# Patient Record
Sex: Male | Born: 1960 | Race: White | Hispanic: No | Marital: Married | Smoking: Current every day smoker
Health system: Southern US, Community
[De-identification: ages and names within clinical notes are randomized; demographics above are authoritative.]

## PROBLEM LIST (undated history)

## (undated) DIAGNOSIS — I639 Cerebral infarction, unspecified: Secondary | ICD-10-CM

## (undated) DIAGNOSIS — E119 Type 2 diabetes mellitus without complications: Secondary | ICD-10-CM

## (undated) DIAGNOSIS — E785 Hyperlipidemia, unspecified: Secondary | ICD-10-CM

## (undated) DIAGNOSIS — I10 Essential (primary) hypertension: Secondary | ICD-10-CM

## (undated) DIAGNOSIS — E7800 Pure hypercholesterolemia, unspecified: Secondary | ICD-10-CM

## (undated) HISTORY — DX: Pure hypercholesterolemia, unspecified: E78.00

## (undated) HISTORY — PX: OTHER SURGICAL HISTORY: SHX169

---

## 2002-08-27 ENCOUNTER — Emergency Department (HOSPITAL_COMMUNITY): Admission: EM | Admit: 2002-08-27 | Discharge: 2002-08-27 | Payer: Self-pay | Admitting: *Deleted

## 2005-08-31 ENCOUNTER — Emergency Department (HOSPITAL_COMMUNITY): Admission: EM | Admit: 2005-08-31 | Discharge: 2005-08-31 | Payer: Self-pay | Admitting: Emergency Medicine

## 2012-12-08 ENCOUNTER — Inpatient Hospital Stay (HOSPITAL_COMMUNITY)
Admission: EM | Admit: 2012-12-08 | Discharge: 2012-12-11 | DRG: 065 | Disposition: A | Discharge status: Home / Self Care | Payer: MEDICAID | Attending: Internal Medicine | Admitting: Internal Medicine

## 2012-12-08 ENCOUNTER — Emergency Department (HOSPITAL_COMMUNITY): Payer: Self-pay

## 2012-12-08 ENCOUNTER — Encounter (HOSPITAL_COMMUNITY): Payer: Self-pay | Admitting: Cardiology

## 2012-12-08 DIAGNOSIS — I639 Cerebral infarction, unspecified: Secondary | ICD-10-CM

## 2012-12-08 DIAGNOSIS — I10 Essential (primary) hypertension: Secondary | ICD-10-CM | POA: Diagnosis present

## 2012-12-08 DIAGNOSIS — I6329 Cerebral infarction due to unspecified occlusion or stenosis of other precerebral arteries: Secondary | ICD-10-CM | POA: Diagnosis present

## 2012-12-08 DIAGNOSIS — E119 Type 2 diabetes mellitus without complications: Secondary | ICD-10-CM | POA: Diagnosis present

## 2012-12-08 DIAGNOSIS — Z8249 Family history of ischemic heart disease and other diseases of the circulatory system: Secondary | ICD-10-CM

## 2012-12-08 DIAGNOSIS — I6340 Cerebral infarction due to embolism of unspecified cerebral artery: Principal | ICD-10-CM | POA: Diagnosis present

## 2012-12-08 DIAGNOSIS — Q2111 Secundum atrial septal defect: Secondary | ICD-10-CM

## 2012-12-08 DIAGNOSIS — F17200 Nicotine dependence, unspecified, uncomplicated: Secondary | ICD-10-CM | POA: Diagnosis present

## 2012-12-08 DIAGNOSIS — I252 Old myocardial infarction: Secondary | ICD-10-CM

## 2012-12-08 DIAGNOSIS — Z8673 Personal history of transient ischemic attack (TIA), and cerebral infarction without residual deficits: Secondary | ICD-10-CM

## 2012-12-08 DIAGNOSIS — E785 Hyperlipidemia, unspecified: Secondary | ICD-10-CM | POA: Diagnosis present

## 2012-12-08 DIAGNOSIS — R9431 Abnormal electrocardiogram [ECG] [EKG]: Secondary | ICD-10-CM | POA: Diagnosis present

## 2012-12-08 DIAGNOSIS — Z79899 Other long term (current) drug therapy: Secondary | ICD-10-CM

## 2012-12-08 DIAGNOSIS — Q211 Atrial septal defect: Secondary | ICD-10-CM

## 2012-12-08 HISTORY — DX: Hyperlipidemia, unspecified: E78.5

## 2012-12-08 HISTORY — DX: Essential (primary) hypertension: I10

## 2012-12-08 LAB — BASIC METABOLIC PANEL
BUN: 26 mg/dL — ABNORMAL HIGH (ref 6–23)
CO2: 29 mEq/L (ref 19–32)
Calcium: 10.4 mg/dL (ref 8.4–10.5)
Chloride: 100 mEq/L (ref 96–112)
Creatinine, Ser: 0.99 mg/dL (ref 0.50–1.35)
GFR calc non Af Amer: >90 mL/min (ref >90)
Potassium: 4.1 mEq/L (ref 3.5–5.1)
Sodium: 142 mEq/L (ref 135–145)

## 2012-12-08 LAB — CBC
HCT: 45.6 % (ref 39.0–52.0)
Hemoglobin: 16.4 g/dL (ref 13.0–17.0)
MCH: 29.9 pg (ref 26.0–34.0)
MCHC: 36 g/dL (ref 30.0–36.0)
MCV: 83.2 fL (ref 78.0–100.0)
Platelets: 257 10*3/uL (ref 150–400)
RBC: 5.48 MIL/uL (ref 4.22–5.81)
WBC: 11 10*3/uL — ABNORMAL HIGH (ref 4.0–10.5)

## 2012-12-08 LAB — POCT I-STAT TROPONIN I: Troponin i, poc: 0 ng/mL (ref 0.00–0.08)

## 2012-12-08 MED ORDER — ASPIRIN EC 81 MG PO TBEC
81.0000 mg | DELAYED_RELEASE_TABLET | Freq: Every day | ORAL | Status: DC
  Administered 2012-12-09: 81 mg via ORAL
  Filled 2012-12-08: qty 1

## 2012-12-08 MED ORDER — HEPARIN SODIUM (PORCINE) 5000 UNIT/ML IJ SOLN
5000.0000 [IU] | Freq: Three times a day (TID) | INTRAMUSCULAR | Status: DC
  Administered 2012-12-09 – 2012-12-11 (×6): 5000 [IU] via SUBCUTANEOUS
  Filled 2012-12-08 (×10): qty 1

## 2012-12-08 MED ORDER — LOSARTAN POTASSIUM-HCTZ 50-12.5 MG PO TABS: 1.0000 | ORAL_TABLET | Freq: Every evening | ORAL | Status: DC

## 2012-12-08 MED ORDER — SIMVASTATIN 10 MG PO TABS
10.0000 mg | ORAL_TABLET | Freq: Every day | ORAL | Status: DC
  Filled 2012-12-08: qty 1

## 2012-12-08 MED ORDER — INSULIN ASPART 100 UNIT/ML ~~LOC~~ SOLN
0.0000 [IU] | Freq: Three times a day (TID) | SUBCUTANEOUS | Status: DC
  Administered 2012-12-09: 5 [IU] via SUBCUTANEOUS
  Administered 2012-12-09 (×2): 3 [IU] via SUBCUTANEOUS
  Administered 2012-12-10: 2 [IU] via SUBCUTANEOUS
  Administered 2012-12-10 – 2012-12-11 (×2): 3 [IU] via SUBCUTANEOUS
  Administered 2012-12-11: 8 [IU] via SUBCUTANEOUS

## 2012-12-08 NOTE — H&P (Addendum)
[facility] History and Physical  Gregory Gordon ZOX:[id number] DOB: [birth year] DOA: 12/08/2012  Referring physician: ED PCP: Default, Provider, MD  Specialists: None  Chief Complaint: Dizziness  HPI: Gregory Gordon is a 52 y.o. male who presents to the ED with dizziness.  Symptoms onset 4 days ago after mowing the grass outside.  Quality is a "room spinning sensation" relieved by lying flat and motionless.  Was associated nausea and vomiting which has since resolved.  Seen by PCP yesterday, given antivert but this provided no relief.  In ED work up demonstrated acute on chronic B cerebellar ischemic strokes, BGL was elevated at 256 (patient has no formal diagnosis of DM though it sounds like such may have been suspected previously), neurology has been consulted and hospitalist has been asked to admit.  Review of Systems: 12 systems reviewed and otherwise negative.  Past Medical History  Diagnosis Date  . Hypertension   . Hyperlipemia    Past Surgical History  Procedure Laterality Date  . Neg hx     Social History:  reports that he has been smoking Cigarettes.  He has been smoking about 1.00 pack per day. He does not have any smokeless tobacco history on file. He reports that he does not drink alcohol or use illicit drugs.   No Known Allergies  Family History  Problem Relation Age of Onset  . Heart attack Brother 16    Younger brother just had 2nd MI     Prior to Admission medications   Medication Sig Start Date End Date Taking? Authorizing Provider  acetaminophen (TYLENOL) 650 MG CR tablet Take 1,950 mg by mouth daily as needed for pain.   Yes Historical Provider, MD  losartan-hydrochlorothiazide (HYZAAR) 50-12.5 MG per tablet Take 1 tablet by mouth every evening.   Yes Historical Provider, MD  meclizine (ANTIVERT) 25 MG tablet Take 25 mg by mouth 3 (three) times daily.   Yes Historical Provider, MD  pravastatin (PRAVACHOL) 20 MG tablet Take 20 mg by mouth every other  day.   Yes Historical Provider, MD   Physical Exam: Filed Vitals:   12/08/12 2145 12/08/12 2200 12/08/12 2222 12/08/12 2300  BP:  129/82 129/82 153/93  Pulse: 62 62 59 67  Temp:      TempSrc:      Resp: 17 20 18 22   SpO2: 90% 93% 91% 94%    General:  NAD, resting comfortably in bed Eyes: PEERLA EOMI ENT: mucous membranes moist Neck: supple w/o JVD Cardiovascular: RRR w/o MRG Respiratory: CTA B Abdomen: soft, nt, nd, bs+ Skin: no rash nor lesion Musculoskeletal: MAE, full ROM all 4 extremities Psychiatric: normal tone and affect Neurologic: AAOx3, questionable abnormal finger to nose testing on the L side, otherwise no obvious focal deficits to my exam  Labs on Admission:  Basic Metabolic Panel:  Recent Labs Lab 12/08/12 1802  NA 142  K 4.1  CL 100  CO2 29  GLUCOSE 256*  BUN 26*  CREATININE 0.99  CALCIUM 10.4   Liver Function Tests: No results found for this basename: AST, ALT, ALKPHOS, BILITOT, PROT, ALBUMIN,  in the last 168 hours No results found for this basename: LIPASE, AMYLASE,  in the last 168 hours No results found for this basename: AMMONIA,  in the last 168 hours CBC:  Recent Labs Lab 12/08/12 1802  WBC 11.0*  HGB 16.4  HCT 45.6  MCV 83.2  PLT 257   Cardiac Enzymes: No results found for this basename: CKTOTAL, CKMB, CKMBINDEX,  TROPONINI,  in the last 168 hours  BNP (last 3 results) No results found for this basename: PROBNP,  in the last 8760 hours CBG: No results found for this basename: GLUCAP,  in the last 168 hours  Radiological Exams on Admission: Dg Chest 2 View  12/08/2012  *RADIOLOGY REPORT*  Clinical Data: 4-day history of nausea and vomiting and dizziness. Smoker with current history of hypertension.  CHEST - 2 VIEW  Comparison: None.  Findings: Suboptimal inspiration accounts for crowded bronchovascular markings, especially in the lung bases, and accentuates the cardiac silhouette.  Taking this into account, cardiac silhouette  upper normal in size.  Hilar and mediastinal contours otherwise unremarkable.  Lungs clear.  No pleural effusions.  Visualized bony thorax intact.  IMPRESSION: Suboptimal inspiration.  No acute cardiopulmonary disease.   Original Report Authenticated By: Hulan Saas, M.D.    Mr [facility] Wo Contrast  12/08/2012  *RADIOLOGY REPORT*  Clinical Data:  52 year old male with dizziness since Saturday. Vertigo.  Chest pain, neck pain.  Comparison: None.  MRI HEAD WITHOUT CONTRAST  Technique: Multiplanar, multiecho pulse sequences of the brain and surrounding structures were obtained according to standard protocol without intravenous contrast.  Findings: There is a 3 cm area of densely restricted diffusion in the inferior left cerebellar hemisphere. The left cerebellar vermis is affected. There is a smaller 15 mm area of densely restricted diffusion in the inferior right cerebellar hemisphere.  There are scattered additional foci of restricted diffusion in the cerebellar hemispheres.  There are underlying chronic infarcts in both posterior cerebellar hemispheres (series 3 image 7  and series 3 100 image 7).  No definite acute or chronic infarcts in the brain stem. Major intracranial vascular flow voids are preserved with intracranial artery dolichoectasia noted. However, there is abnormal increased FLAIR signal and decreased T2 signal in the the left PICA or a branch of the left PICA (series 6 image 5 and series 8 image 5) compatible with a slow flow or thrombus.  There is adjacent either punctate petechial hemorrhage or additional intravascular thrombus near the left cerebellar vermis (series 8 image 5).  MRA findings are below.  Associated T2 and FLAIR hyperintensity plus T1 hypointensity in the acutely affected areas.  No significant mass effect. No other acute intracranial hemorrhage.  No supratentorial diffusion abnormality.  No ventriculomegaly. Negative pituitary.  Negative cervicomedullary junction and  visualized cervical spine.  Scattered patchy cerebral white matter T2 and FLAIR hyperintensity, mild to moderate for age.  No supratentorial encephalomalacia.  Visualized bone marrow signal is within normal limits.  Visualized orbit soft tissues are within normal limits.  Minor paranasal sinus mucosal thickening.  Mastoids are clear.  Negative visualized internal auditory structures.  Negative scalp soft tissues.  IMPRESSION: 1.  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 No significant mass effect.  Possible petechial hemorrhage in the left cerebellar vermis.  Evidence of left PICA or a PICA branch slow flow or occlusion. 2.  No other acute intracranial abnormality.  Intracranial artery dolichoectasia.  See MRA findings below.  3.  Mild to moderate for age nonspecific cerebral white matter signal changes, favor chronic small vessel disease.  MRA HEAD WITHOUT CONTRAST  Technique: Angiographic images of the Circle of Willis were obtained using MRA technique without  intravenous contrast.  Findings: Antegrade flow in codominant distal vertebral arteries. Moderate to severe tortuosity of the V4 segments and vertebrobasilar junction.  No definite stenosis; mild asymmetric signal intensity is felt related to the  vessel tortuosity.  No PICA flow signal identified on either side.  No definite AICA flow signal.  No basilar artery stenosis.  SCA and PCA origins are within normal limits.  Posterior communicating arteries are diminutive or absent.  Bilateral PCA branches are within normal limits.  Antegrade flow in both ICA siphons.  Mildly tortuous distal cervical right ICA.  Mild to moderate cavernous ICA tortuosity. Both ophthalmic artery origins are within normal limits.  Normal carotid termini, MCA and ACA origins.  Anterior communicating artery is diminutive or absent.  Tortuous proximal ACA branches. Bilateral MCA M1 segments are within normal limits.  Mild irregularity of the bilateral M2  segments.  Otherwise negative visualized bilateral MCA branches.  IMPRESSION: 1.  No bilateral PICA or AICA flow signal detected.  Tortuous distal vertebral arteries appear to be patent without definite lesion. 2.  Otherwise negative posterior circulation. 3.  Mild to moderate ICA siphon tortuosity.  Mild bilateral MCA M2 branch irregularity. 4.  Otherwise negative anterior circulation.  Salient findings on this study discussed on 12/08/2012 at 2215 hours with PA Sharilyn Sites in the ED.   Original Report Authenticated By: Erskine Speed, M.D.    Mr Brain Wo Contrast  12/08/2012  *RADIOLOGY REPORT*  Clinical Data:  52 year old male with dizziness since Saturday. Vertigo.  Chest pain, neck pain.  Comparison: None.  MRI HEAD WITHOUT CONTRAST  Technique: Multiplanar, multiecho pulse sequences of the brain and surrounding structures were obtained according to standard protocol without intravenous contrast.  Findings: There is a 3 cm area of densely restricted diffusion in the inferior left cerebellar hemisphere. The left cerebellar vermis is affected. There is a smaller 15 mm area of densely restricted diffusion in the inferior right cerebellar hemisphere.  There are scattered additional foci of restricted diffusion in the cerebellar hemispheres.  There are underlying chronic infarcts in both posterior cerebellar hemispheres (series 3 image 7  and series 3 100 image 7).  No definite acute or chronic infarcts in the brain stem. Major intracranial vascular flow voids are preserved with intracranial artery dolichoectasia noted. However, there is abnormal increased FLAIR signal and decreased T2 signal in the the left PICA or a branch of the left PICA (series 6 image 5 and series 8 image 5) compatible with a slow flow or thrombus.  There is adjacent either punctate petechial hemorrhage or additional intravascular thrombus near the left cerebellar vermis (series 8 image 5).  MRA findings are below.  Associated T2 and FLAIR  hyperintensity plus T1 hypointensity in the acutely affected areas.  No significant mass effect. No other acute intracranial hemorrhage.  No supratentorial diffusion abnormality.  No ventriculomegaly. Negative pituitary.  Negative cervicomedullary junction and visualized cervical spine.  Scattered patchy cerebral white matter T2 and FLAIR hyperintensity, mild to moderate for age.  No supratentorial encephalomalacia.  Visualized bone marrow signal is within normal limits.  Visualized orbit soft tissues are within normal limits.  Minor paranasal sinus mucosal thickening.  Mastoids are clear.  Negative visualized internal auditory structures.  Negative scalp soft tissues.  IMPRESSION: 1.  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 No significant mass effect.  Possible petechial hemorrhage in the left cerebellar vermis.  Evidence of left PICA or a PICA branch slow flow or occlusion. 2.  No other acute intracranial abnormality.  Intracranial artery dolichoectasia.  See MRA findings below.  3.  Mild to moderate for age nonspecific cerebral white matter signal changes, favor chronic small vessel disease.  MRA  HEAD WITHOUT CONTRAST  Technique: Angiographic images of the Circle of Willis were obtained using MRA technique without  intravenous contrast.  Findings: Antegrade flow in codominant distal vertebral arteries. Moderate to severe tortuosity of the V4 segments and vertebrobasilar junction.  No definite stenosis; mild asymmetric signal intensity is felt related to the vessel tortuosity.  No PICA flow signal identified on either side.  No definite AICA flow signal.  No basilar artery stenosis.  SCA and PCA origins are within normal limits.  Posterior communicating arteries are diminutive or absent.  Bilateral PCA branches are within normal limits.  Antegrade flow in both ICA siphons.  Mildly tortuous distal cervical right ICA.  Mild to moderate cavernous ICA tortuosity. Both ophthalmic artery  origins are within normal limits.  Normal carotid termini, MCA and ACA origins.  Anterior communicating artery is diminutive or absent.  Tortuous proximal ACA branches. Bilateral MCA M1 segments are within normal limits.  Mild irregularity of the bilateral M2 segments.  Otherwise negative visualized bilateral MCA branches.  IMPRESSION: 1.  No bilateral PICA or AICA flow signal detected.  Tortuous distal vertebral arteries appear to be patent without definite lesion. 2.  Otherwise negative posterior circulation. 3.  Mild to moderate ICA siphon tortuosity.  Mild bilateral MCA M2 branch irregularity. 4.  Otherwise negative anterior circulation.  Salient findings on this study discussed on 12/08/2012 at 2215 hours with PA Sharilyn Sites in the ED.   Original Report Authenticated By: Erskine Speed, M.D.     EKG: Independently reviewed.  No prior available for comparison.  Q waves noted in inferior leads, see discussion below.  Assessment/Plan Principal Problem:   Arterial ischemic stroke, vertebrobasilar, cerebellar, acute Active Problems:   DM2 (diabetes mellitus, type 2)   Dyslipidemia   HTN (hypertension)   Family history of early CAD   Q waves suggestive of previous myocardial infarction   1. Acute on chronic ischemic stroke - neurology to see patient suspect he will be put on ASA, patient with numerous risk factors for atherosclerotic disease, specifically: 1. family history of early onset CAD (51 yo younger brother just had 2nd MI) 2. high cholesterol - continue home statin, checking cholesterol with AM labs 3. HTN - continue HTN meds, given that he is 4 days into this stroke dont think that permissive HTN has any role at this point, will of course defer to neurology on this if they recommend otherwise. 4. Smoking - addressed need to quit with patient, patient agrees to do so 5. Strongly suspect DM2 - A1C pending, BGL 250s in ED, has had multiple very high readings in past per patient but not on  any formal treatment.  Putting patient on carb modified diet and starting SSI while inpatient, likely will need long term treatment for this. 2. Plan - as part of the work up have ordered the B carotid US, although this would NOT be the cause of the posterior strokes, I feel it to be reasonable given my concern of underlying atherosclerotic disease, would cancel this if neurology has a different vascular imaging modality planned for the patients neck (re: vertebral arteries). 3. Q waves concerning for possible previous MI - while the Q wave in lead 3 could be a normal variant, his history of confirmed atherosclerotic disease (with multiple strokes), strong family history including early onset MIs, and every other risk factor, are concerning for CAD.  Probably should see a cardiologist as well after this visit though this is not emergent as he  is having no symptoms at present time.  Neurology consulted and is seeing the patient, expect they will likely start ASA on patient.  Code Status: Full Code (must indicate code status--if unknown or must be presumed, indicate so) Family Communication: Spoke with family at bedside (indicate person spoken with, if applicable, with phone number if by telephone) Disposition Plan: Admit to inpatient (indicate anticipated LOS)  Time spent: 70 min  GARDNER, JARED M. [facility] Pager [phone]  If 7PM-7AM, please contact night-coverage [email or web address] Password [facility] 12/08/2012, 11:17 PM

## 2012-12-08 NOTE — ED Notes (Signed)
Pt reports on Saturday he was working and started feeling dizzy while at work. C/o nausea and increased vomiting. Reports he went to the doctor yesterday and given a rx for antivert. Pt reports he feels like the room is spinning and like he wants to pass out. Reports chest pain and neck pain. No neuro deficits noted at triage.

## 2012-12-08 NOTE — ED Provider Notes (Signed)
History     CSN: [id number]  Arrival date & time 12/08/12  1745   First MD Initiated Contact with Patient 12/08/12 1813      Chief Complaint  Patient presents with  . Chest Pain    (Consider location/radiation/quality/duration/timing/severity/associated sxs/prior treatment) HPI Comments: Pt with PMH significant for HTN, HLP, presents to the ED for dizziness x 4 days.  States he was mowing the grass and when he got off his lawn mower he lost his balance and fell to the ground.  Denies head injury or LOC.  Dizziness is described as a room spinning sensation that is only relieved by lying flat and motionless.  There was associated nausea and vomiting which has since resolved.  Was seen by PCP yesterday and given antivert.  Has taken approximately 3 doses without any relief.  No prior episodes of dizziness like this before.  No inner ear problems noted.  Denies any chest pain, SOB, abdominal pain, fever, chills, neck pain or stiffness.  Patient is a 52 y.o. male presenting with chest pain. The history is provided by the patient and the spouse.  Chest Pain Associated symptoms: dizziness     Past Medical History  Diagnosis Date  . Hypertension   . Hyperlipemia     History reviewed. No pertinent past surgical history.  History reviewed. No pertinent family history.  History  Substance Use Topics  . Smoking status: Current Every Day Smoker  . Smokeless tobacco: Not on file  . Alcohol Use: No      Review of Systems  Cardiovascular: Positive for chest pain.  Neurological: Positive for dizziness.  All other systems reviewed and are negative.    Allergies  Review of patient's allergies indicates no known allergies.  Home Medications  No current outpatient prescriptions on file.  BP 134/92  Pulse 82  Temp(Src) 99.3 F (37.4 C) (Oral)  Resp 18  SpO2 98%  Physical Exam  Nursing note and vitals reviewed. Constitutional: He is oriented to person, place, and time. He  appears well-developed and well-nourished.  HENT:  Head: Normocephalic and atraumatic.  Mouth/Throat: Oropharynx is clear and moist.  Eyes: Conjunctivae and EOM are normal. Pupils are equal, round, and reactive to light.  Neck: Normal range of motion and full passive range of motion without pain. Neck supple. No spinous process tenderness present. No rigidity. Normal range of motion present.  Cardiovascular: Normal rate, regular rhythm, normal heart sounds and normal pulses.   Pulmonary/Chest: Effort normal and breath sounds normal. He has no wheezes.  Abdominal: Soft. Bowel sounds are normal. There is no tenderness. There is no CVA tenderness, no tenderness at McBurney's point and negative Murphy's sign.  Musculoskeletal: Normal range of motion. He exhibits no edema.  Neurological: He is alert and oriented to person, place, and time. He has normal strength. No cranial nerve deficit or sensory deficit.  Normal strength in BUE and BLE, no facial droop appreciated  Skin: Skin is warm and dry.  Psychiatric: He has a normal mood and affect. His speech is normal.    ED Course  Procedures (including critical care time)   Date: 12/08/2012  Rate: 84  Rhythm: normal sinus rhythm  QRS Axis: normal  Intervals: normal  ST/T Wave abnormalities: normal  Conduction Disutrbances:none  Narrative Interpretation: q-waves present, possible prior infarct  Old EKG Reviewed: none available    Labs Reviewed  CBC - Abnormal; Notable for the following:    WBC 11.0 (*)    All other components  within normal limits  BASIC METABOLIC PANEL - Abnormal; Notable for the following:    Glucose, Bld 256 (*)    BUN 26 (*)    All other components within normal limits  LIPID PANEL - Abnormal; Notable for the following:    Cholesterol 251 (*)    Triglycerides 386 (*)    HDL 30 (*)    VLDL 77 (*)    LDL Cholesterol 144 (*)    All other components within normal limits  GLUCOSE, CAPILLARY - Abnormal; Notable for  the following:    Glucose-Capillary 173 (*)    All other components within normal limits  HEMOGLOBIN A1C  POCT I-STAT TROPONIN I   Dg Chest 2 View  12/08/2012  *RADIOLOGY REPORT*  Clinical Data: 4-day history of nausea and vomiting and dizziness. Smoker with current history of hypertension.  CHEST - 2 VIEW  Comparison: None.  Findings: Suboptimal inspiration accounts for crowded bronchovascular markings, especially in the lung bases, and accentuates the cardiac silhouette.  Taking this into account, cardiac silhouette upper normal in size.  Hilar and mediastinal contours otherwise unremarkable.  Lungs clear.  No pleural effusions.  Visualized bony thorax intact.  IMPRESSION: Suboptimal inspiration.  No acute cardiopulmonary disease.   Original Report Authenticated By: Hulan Saas, M.D.    [facility]  12/08/2012  *RADIOLOGY REPORT*  Clinical Data:  52 year old male with dizziness since Saturday. Vertigo.  Chest pain, neck pain.  Comparison: None.  MRI HEAD WITHOUT CONTRAST  Technique: Multiplanar, multiecho pulse sequences of the brain and surrounding structures were obtained according to standard protocol without intravenous contrast.  Findings: There is a 3 cm area of densely restricted diffusion in the inferior left cerebellar hemisphere. The left cerebellar vermis is affected. There is a smaller 15 mm area of densely restricted diffusion in the inferior right cerebellar hemisphere.  There are scattered additional foci of restricted diffusion in the cerebellar hemispheres.  There are underlying chronic infarcts in both posterior cerebellar hemispheres (series 3 image 7  and series 3 100 image 7).  No definite acute or chronic infarcts in the brain stem. Major intracranial vascular flow voids are preserved with intracranial artery dolichoectasia noted. However, there is abnormal increased FLAIR signal and decreased T2 signal in the the left PICA or a branch of the left PICA (series 6  image 5 and series 8 image 5) compatible with a slow flow or thrombus.  There is adjacent either punctate petechial hemorrhage or additional intravascular thrombus near the left cerebellar vermis (series 8 image 5).  MRA findings are below.  Associated T2 and FLAIR hyperintensity plus T1 hypointensity in the acutely affected areas.  No significant mass effect. No other acute intracranial hemorrhage.  No supratentorial diffusion abnormality.  No ventriculomegaly. Negative pituitary.  Negative cervicomedullary junction and visualized cervical spine.  Scattered patchy cerebral white matter T2 and FLAIR hyperintensity, mild to moderate for age.  No supratentorial encephalomalacia.  Visualized bone marrow signal is within normal limits.  Visualized orbit soft tissues are within normal limits.  Minor paranasal sinus mucosal thickening.  Mastoids are clear.  Negative visualized internal auditory structures.  Negative scalp soft tissues.  IMPRESSION: 1.  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 No significant mass effect.  Possible petechial hemorrhage in the left cerebellar vermis.  Evidence of left PICA or a PICA branch slow flow or occlusion. 2.  No other acute intracranial abnormality.  Intracranial artery dolichoectasia.  See MRA findings below.  3.  Mild to moderate for age nonspecific cerebral white matter signal changes, favor chronic small vessel disease.  MRA HEAD WITHOUT CONTRAST  Technique: Angiographic images of the Circle of Willis were obtained using MRA technique without  intravenous contrast.  Findings: Antegrade flow in codominant distal vertebral arteries. Moderate to severe tortuosity of the V4 segments and vertebrobasilar junction.  No definite stenosis; mild asymmetric signal intensity is felt related to the vessel tortuosity.  No PICA flow signal identified on either side.  No definite AICA flow signal.  No basilar artery stenosis.  SCA and PCA origins are within normal  limits.  Posterior communicating arteries are diminutive or absent.  Bilateral PCA branches are within normal limits.  Antegrade flow in both ICA siphons.  Mildly tortuous distal cervical right ICA.  Mild to moderate cavernous ICA tortuosity. Both ophthalmic artery origins are within normal limits.  Normal carotid termini, MCA and ACA origins.  Anterior communicating artery is diminutive or absent.  Tortuous proximal ACA branches. Bilateral MCA M1 segments are within normal limits.  Mild irregularity of the bilateral M2 segments.  Otherwise negative visualized bilateral MCA branches.  IMPRESSION: 1.  No bilateral PICA or AICA flow signal detected.  Tortuous distal vertebral arteries appear to be patent without definite lesion. 2.  Otherwise negative posterior circulation. 3.  Mild to moderate ICA siphon tortuosity.  Mild bilateral MCA M2 branch irregularity. 4.  Otherwise negative anterior circulation.  Salient findings on this study discussed on 12/08/2012 at 2215 hours with PA Sharilyn Sites in the ED.   Original Report Authenticated By: Erskine Speed, M.D.    Mr [facility] Contrast  12/08/2012  *RADIOLOGY REPORT*  Clinical Data:  52 year old male with dizziness since Saturday. Vertigo.  Chest pain, neck pain.  Comparison: None.  MRI HEAD WITHOUT CONTRAST  Technique: Multiplanar, multiecho pulse sequences of the brain and surrounding structures were obtained according to standard protocol without intravenous contrast.  Findings: There is a 3 cm area of densely restricted diffusion in the inferior left cerebellar hemisphere. The left cerebellar vermis is affected. There is a smaller 15 mm area of densely restricted diffusion in the inferior right cerebellar hemisphere.  There are scattered additional foci of restricted diffusion in the cerebellar hemispheres.  There are underlying chronic infarcts in both posterior cerebellar hemispheres (series 3 image 7  and series 3 100 image 7).  No definite acute or chronic  infarcts in the brain stem. Major intracranial vascular flow voids are preserved with intracranial artery dolichoectasia noted. However, there is abnormal increased FLAIR signal and decreased T2 signal in the the left PICA or a branch of the left PICA (series 6 image 5 and series 8 image 5) compatible with a slow flow or thrombus.  There is adjacent either punctate petechial hemorrhage or additional intravascular thrombus near the left cerebellar vermis (series 8 image 5).  MRA findings are below.  Associated T2 and FLAIR hyperintensity plus T1 hypointensity in the acutely affected areas.  No significant mass effect. No other acute intracranial hemorrhage.  No supratentorial diffusion abnormality.  No ventriculomegaly. Negative pituitary.  Negative cervicomedullary junction and visualized cervical spine.  Scattered patchy cerebral white matter T2 and FLAIR hyperintensity, mild to moderate for age.  No supratentorial encephalomalacia.  Visualized bone marrow signal is within normal limits.  Visualized orbit soft tissues are within normal limits.  Minor paranasal sinus mucosal thickening.  Mastoids are clear.  Negative visualized internal auditory structures.  Negative scalp soft tissues.  IMPRESSION: 1.  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 No significant mass effect.  Possible petechial hemorrhage in the left cerebellar vermis.  Evidence of left PICA or a PICA branch slow flow or occlusion. 2.  No other acute intracranial abnormality.  Intracranial artery dolichoectasia.  See MRA findings below.  3.  Mild to moderate for age nonspecific cerebral white matter signal changes, favor chronic small vessel disease.  MRA HEAD WITHOUT CONTRAST  Technique: Angiographic images of the Circle of Willis were obtained using MRA technique without  intravenous contrast.  Findings: Antegrade flow in codominant distal vertebral arteries. Moderate to severe tortuosity of the V4 segments and  vertebrobasilar junction.  No definite stenosis; mild asymmetric signal intensity is felt related to the vessel tortuosity.  No PICA flow signal identified on either side.  No definite AICA flow signal.  No basilar artery stenosis.  SCA and PCA origins are within normal limits.  Posterior communicating arteries are diminutive or absent.  Bilateral PCA branches are within normal limits.  Antegrade flow in both ICA siphons.  Mildly tortuous distal cervical right ICA.  Mild to moderate cavernous ICA tortuosity. Both ophthalmic artery origins are within normal limits.  Normal carotid termini, MCA and ACA origins.  Anterior communicating artery is diminutive or absent.  Tortuous proximal ACA branches. Bilateral MCA M1 segments are within normal limits.  Mild irregularity of the bilateral M2 segments.  Otherwise negative visualized bilateral MCA branches.  IMPRESSION: 1.  No bilateral PICA or AICA flow signal detected.  Tortuous distal vertebral arteries appear to be patent without definite lesion. 2.  Otherwise negative posterior circulation. 3.  Mild to moderate ICA siphon tortuosity.  Mild bilateral MCA M2 branch irregularity. 4.  Otherwise negative anterior circulation.  Salient findings on this study discussed on 12/08/2012 at 2215 hours with PA Sharilyn Sites in the ED.   Original Report Authenticated By: Erskine Speed, M.D.      1. Stroke   2. Arterial ischemic stroke, vertebrobasilar, cerebellar, acute   3. DM2 (diabetes mellitus, type 2)   4. Dyslipidemia   5. HTN (hypertension)       MDM   Pt with PMH significant for HTN, HLP, presents to the ED for dizziness x 4 days.  States he was mowing the grass and when he got off his lawn mower he lost his balance and fell to the ground.  Denies head injury or LOC.  Dizziness is described as a room spinning sensation that is only relieved by lying flat and motionless.  There was associated nausea and vomiting which has since resolved.  Given nature of sx and  lack of relief with antivert, concern for central vertigo.  MRI head and brain findings as above- notified by radiology.  Neuro was consulted.  [facility] will [facility].        Garlon Hatchet, PA-C 12/09/12 1050

## 2012-12-08 NOTE — Consult Note (Signed)
Referring Physician: ED    Chief Complaint: Dizziness, dysequilibrium, nausea and vomiting.   HPI:                                                                                                                                         Gregory Gordon is an 52 y.o. male, right handed, with a past medical history sognificant for hypertension, hyperlipidemia with poor adherence to treatment, heavy smoker, who was in his usual state of heath until 12/05/12 when developed sudden onset of dizziness, imbalance, nausea, and vomiting. He doesn't recall having headache, double vision, difficulty swallowing, focal weakness, language or vision impairment. States he was mowing the grass and when he got off his lawn mower he lost his balance and fell to the ground. Gregory Gordon expressed that he neve had similar symptoms before, but ever since has been bothered by a spinning sensation and lack of balance every time he tries to stand up and walk. Did not seek immediate medical attention and waited until yesterday when he decided to see his primary care physician who prescribed Antivert. He did not get any better and thus came to the ED for further evaluation. 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 No significant mass effect. Possible petechial hemorrhage in the left cerebellar vermis. MRA brain no bilateral PICA or AICA flow signal detected. BA and PCA are okay, as well the anterior circulation.   Date last known well: 11/25/12 Time last known well: uncertain.  tPA Given: no, late presentation.  Past Medical History  Diagnosis Date  . Hypertension   . Hyperlipemia     Past Surgical History  Procedure Laterality Date  . Neg hx      Family History  Problem Relation Age of Onset  . Heart attack Brother 62    Younger brother just had 2nd MI   Social History:  reports that he has been smoking Cigarettes.  He has been smoking about 1.00 pack per day. He does  not have any smokeless tobacco history on file. He reports that he does not drink alcohol or use illicit drugs.  Allergies: No Known Allergies  Medications:                                                                                                                           I  have reviewed the patient's current medications.  ROS:                                                                                                                                       History obtained from the patient, family, and chart review.  General ROS: negative for - chills, fatigue, fever, night sweats, weight gain or weight loss Psychological ROS: negative for - behavioral disorder, hallucinations, memory difficulties, mood swings or suicidal ideation Ophthalmic ROS: negative for - blurry vision, double vision, eye pain or loss of vision ENT ROS: negative for - epistaxis, nasal discharge, oral lesions, sore throat, tinnitus or vertigo Allergy and Immunology ROS: negative for - hives or itchy/watery eyes Hematological and Lymphatic ROS: negative for - bleeding problems, bruising or swollen lymph nodes Endocrine ROS: negative for - galactorrhea, hair pattern changes, polydipsia/polyuria or temperature intolerance Respiratory ROS: negative for - cough, hemoptysis, shortness of breath or wheezing Cardiovascular ROS: negative for - chest pain, dyspnea on exertion, edema or irregular heartbeat Gastrointestinal ROS: negative for - abdominal pain, diarrhea, hematemesis, nausea/vomiting or stool incontinence Genito-Urinary ROS: negative for - dysuria, hematuria, incontinence or urinary frequency/urgency Musculoskeletal ROS: negative for - joint swelling or muscular weakness Neurological ROS: as noted in HPI Dermatological ROS: negative for rash and skin lesion changes   Physical exam: pleasant male in no apparent distress. Blood pressure 153/93, pulse 67, temperature 99.8 F (37.7 C), temperature source Oral,  resp. rate 22, SpO2 94.00%. Head: normocephalic. Neck: supple, no bruits, no JVD. Cardiac: no murmurs. Lungs: clear. Abdomen: soft, no tender, no mass. Extremities: no edema.  Neurologic Examination:                                                                                                      Mental Status: Alert, awake, oriented x 4, thought content appropriate.  Speech fluent without evidence of aphasia.  Able to follow 3 step commands without difficulty. Cranial Nerves: II: Discs flat bilaterally; Visual fields grossly normal, pupils equal, round, reactive to light and accommodation III,IV, VI: ptosis not present, extra-ocular motions intact bilaterally V,VII: smile symmetric, facial light touch sensation normal bilaterally VIII: hearing normal bilaterally IX,X: gag reflex present XI: bilateral shoulder shrug XII: midline tongue extension Motor: Right : Upper extremity   5/5    Left:     Upper extremity   5/5  Lower extremity   5/5     Lower extremity   5/5 Tone and bulk:normal tone throughout; no  atrophy noted Sensory: Pinprick and light touch intact throughout, bilaterally Deep Tendon Reflexes: 2+ and symmetric throughout Plantars: Right: downgoing   Left: downgoing Cerebellar: normal finger-to-nose,  normal heel-to-shin test Gait: ataxic. CV: pulses palpable throughout     Results for orders placed during the hospital encounter of 12/08/12 (from the past 48 hour(s))  CBC     Status: Abnormal   Collection Time    12/08/12  6:02 PM      Result Value Range   WBC 11.0 (*) 4.0 - 10.5 K/uL   RBC 5.48  4.22 - 5.81 MIL/uL   Hemoglobin 16.4  13.0 - 17.0 g/dL   HCT 16.1  09.6 - 04.5 %   MCV 83.2  78.0 - 100.0 fL   MCH 29.9  26.0 - 34.0 pg   MCHC 36.0  30.0 - 36.0 g/dL   RDW 40.9  81.1 - 91.4 %   Platelets 257  150 - 400 K/uL  BASIC METABOLIC PANEL     Status: Abnormal   Collection Time    12/08/12  6:02 PM      Result Value Range   Sodium 142  135 - 145 mEq/L    Potassium 4.1  3.5 - 5.1 mEq/L   Chloride 100  96 - 112 mEq/L   CO2 29  19 - 32 mEq/L   Glucose, Bld 256 (*) 70 - 99 mg/dL   BUN 26 (*) 6 - 23 mg/dL   Creatinine, Ser 7.82  0.50 - 1.35 mg/dL   Calcium 95.6  8.4 - 21.3 mg/dL   GFR calc non Af Amer >90  >90 mL/min   GFR calc Af Amer >90  >90 mL/min   Comment:            The eGFR has been calculated     using the CKD EPI equation.     This calculation has not been     validated in all clinical     situations.     eGFR's persistently     <90 mL/min signify     possible Chronic Kidney Disease.  POCT I-STAT TROPONIN I     Status: None   Collection Time    12/08/12  6:15 PM      Result Value Range   Troponin i, poc 0.00  0.00 - 0.08 ng/mL   Comment 3            Comment: Due to the release kinetics of cTnI,     a negative result within the first hours     of the onset of symptoms does not rule out     myocardial infarction with certainty.     If myocardial infarction is still suspected,     repeat the test at appropriate intervals.   Dg Chest 2 View  12/08/2012  *RADIOLOGY REPORT*  Clinical Data: 4-day history of nausea and vomiting and dizziness. Smoker with current history of hypertension.  CHEST - 2 VIEW  Comparison: None.  Findings: Suboptimal inspiration accounts for crowded bronchovascular markings, especially in the lung bases, and accentuates the cardiac silhouette.  Taking this into account, cardiac silhouette upper normal in size.  Hilar and mediastinal contours otherwise unremarkable.  Lungs clear.  No pleural effusions.  Visualized bony thorax intact.  IMPRESSION: Suboptimal inspiration.  No acute cardiopulmonary disease.   Original Report Authenticated By: Hulan Saas, M.D.    Mr [facility] Wo Contrast  12/08/2012  *RADIOLOGY REPORT*  Clinical Data:  52 year old male with dizziness  since Saturday. Vertigo.  Chest pain, neck pain.  Comparison: None.  MRI HEAD WITHOUT CONTRAST  Technique: Multiplanar, multiecho pulse sequences  of the brain and surrounding structures were obtained according to standard protocol without intravenous contrast.  Findings: There is a 3 cm area of densely restricted diffusion in the inferior left cerebellar hemisphere. The left cerebellar vermis is affected. There is a smaller 15 mm area of densely restricted diffusion in the inferior right cerebellar hemisphere.  There are scattered additional foci of restricted diffusion in the cerebellar hemispheres.  There are underlying chronic infarcts in both posterior cerebellar hemispheres (series 3 image 7  and series 3 100 image 7).  No definite acute or chronic infarcts in the brain stem. Major intracranial vascular flow voids are preserved with intracranial artery dolichoectasia noted. However, there is abnormal increased FLAIR signal and decreased T2 signal in the the left PICA or a branch of the left PICA (series 6 image 5 and series 8 image 5) compatible with a slow flow or thrombus.  There is adjacent either punctate petechial hemorrhage or additional intravascular thrombus near the left cerebellar vermis (series 8 image 5).  MRA findings are below.  Associated T2 and FLAIR hyperintensity plus T1 hypointensity in the acutely affected areas.  No significant mass effect. No other acute intracranial hemorrhage.  No supratentorial diffusion abnormality.  No ventriculomegaly. Negative pituitary.  Negative cervicomedullary junction and visualized cervical spine.  Scattered patchy cerebral white matter T2 and FLAIR hyperintensity, mild to moderate for age.  No supratentorial encephalomalacia.  Visualized bone marrow signal is within normal limits.  Visualized orbit soft tissues are within normal limits.  Minor paranasal sinus mucosal thickening.  Mastoids are clear.  Negative visualized internal auditory structures.  Negative scalp soft tissues.  IMPRESSION: 1.  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 No significant mass effect.   Possible petechial hemorrhage in the left cerebellar vermis.  Evidence of left PICA or a PICA branch slow flow or occlusion. 2.  No other acute intracranial abnormality.  Intracranial artery dolichoectasia.  See MRA findings below.  3.  Mild to moderate for age nonspecific cerebral white matter signal changes, favor chronic small vessel disease.  MRA HEAD WITHOUT CONTRAST  Technique: Angiographic images of the Circle of Willis were obtained using MRA technique without  intravenous contrast.  Findings: Antegrade flow in codominant distal vertebral arteries. Moderate to severe tortuosity of the V4 segments and vertebrobasilar junction.  No definite stenosis; mild asymmetric signal intensity is felt related to the vessel tortuosity.  No PICA flow signal identified on either side.  No definite AICA flow signal.  No basilar artery stenosis.  SCA and PCA origins are within normal limits.  Posterior communicating arteries are diminutive or absent.  Bilateral PCA branches are within normal limits.  Antegrade flow in both ICA siphons.  Mildly tortuous distal cervical right ICA.  Mild to moderate cavernous ICA tortuosity. Both ophthalmic artery origins are within normal limits.  Normal carotid termini, MCA and ACA origins.  Anterior communicating artery is diminutive or absent.  Tortuous proximal ACA branches. Bilateral MCA M1 segments are within normal limits.  Mild irregularity of the bilateral M2 segments.  Otherwise negative visualized bilateral MCA branches.  IMPRESSION: 1.  No bilateral PICA or AICA flow signal detected.  Tortuous distal vertebral arteries appear to be patent without definite lesion. 2.  Otherwise negative posterior circulation. 3.  Mild to moderate ICA siphon tortuosity.  Mild bilateral MCA M2 branch irregularity. 4.  Otherwise negative anterior circulation.  Salient findings on this study discussed on 12/08/2012 at 2215 hours with PA Sharilyn Sites in the ED.   Original Report Authenticated By: Erskine Speed, M.D.    Mr Brain Wo Contrast  12/08/2012  *RADIOLOGY REPORT*  Clinical Data:  52 year old male with dizziness since Saturday. Vertigo.  Chest pain, neck pain.  Comparison: None.  MRI HEAD WITHOUT CONTRAST  Technique: Multiplanar, multiecho pulse sequences of the brain and surrounding structures were obtained according to standard protocol without intravenous contrast.  Findings: There is a 3 cm area of densely restricted diffusion in the inferior left cerebellar hemisphere. The left cerebellar vermis is affected. There is a smaller 15 mm area of densely restricted diffusion in the inferior right cerebellar hemisphere.  There are scattered additional foci of restricted diffusion in the cerebellar hemispheres.  There are underlying chronic infarcts in both posterior cerebellar hemispheres (series 3 image 7  and series 3 100 image 7).  No definite acute or chronic infarcts in the brain stem. Major intracranial vascular flow voids are preserved with intracranial artery dolichoectasia noted. However, there is abnormal increased FLAIR signal and decreased T2 signal in the the left PICA or a branch of the left PICA (series 6 image 5 and series 8 image 5) compatible with a slow flow or thrombus.  There is adjacent either punctate petechial hemorrhage or additional intravascular thrombus near the left cerebellar vermis (series 8 image 5).  MRA findings are below.  Associated T2 and FLAIR hyperintensity plus T1 hypointensity in the acutely affected areas.  No significant mass effect. No other acute intracranial hemorrhage.  No supratentorial diffusion abnormality.  No ventriculomegaly. Negative pituitary.  Negative cervicomedullary junction and visualized cervical spine.  Scattered patchy cerebral white matter T2 and FLAIR hyperintensity, mild to moderate for age.  No supratentorial encephalomalacia.  Visualized bone marrow signal is within normal limits.  Visualized orbit soft tissues are within normal limits.  Minor  paranasal sinus mucosal thickening.  Mastoids are clear.  Negative visualized internal auditory structures.  Negative scalp soft tissues.  IMPRESSION: 1.  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 No significant mass effect.  Possible petechial hemorrhage in the left cerebellar vermis.  Evidence of left PICA or a PICA branch slow flow or occlusion. 2.  No other acute intracranial abnormality.  Intracranial artery dolichoectasia.  See MRA findings below.  3.  Mild to moderate for age nonspecific cerebral white matter signal changes, favor chronic small vessel disease.  MRA HEAD WITHOUT CONTRAST  Technique: Angiographic images of the Circle of Willis were obtained using MRA technique without  intravenous contrast.  Findings: Antegrade flow in codominant distal vertebral arteries. Moderate to severe tortuosity of the V4 segments and vertebrobasilar junction.  No definite stenosis; mild asymmetric signal intensity is felt related to the vessel tortuosity.  No PICA flow signal identified on either side.  No definite AICA flow signal.  No basilar artery stenosis.  SCA and PCA origins are within normal limits.  Posterior communicating arteries are diminutive or absent.  Bilateral PCA branches are within normal limits.  Antegrade flow in both ICA siphons.  Mildly tortuous distal cervical right ICA.  Mild to moderate cavernous ICA tortuosity. Both ophthalmic artery origins are within normal limits.  Normal carotid termini, MCA and ACA origins.  Anterior communicating artery is diminutive or absent.  Tortuous proximal ACA branches. Bilateral MCA M1 segments are within normal limits.  Mild irregularity of the bilateral M2 segments.  Otherwise negative visualized bilateral MCA branches.  IMPRESSION: 1.  No bilateral PICA or AICA flow signal detected.  Tortuous distal vertebral arteries appear to be patent without definite lesion. 2.  Otherwise negative posterior circulation. 3.  Mild to moderate  ICA siphon tortuosity.  Mild bilateral MCA M2 branch irregularity. 4.  Otherwise negative anterior circulation.  Salient findings on this study discussed on 12/08/2012 at 2215 hours with PA Sharilyn Sites in the ED.   Original Report Authenticated By: Erskine Speed, M.D.       Assessment: 52 y.o. male with acute bilateral cerebellar hemisphere and left vermis infarcts with lack of blood flow signal bilateral PICA and AICA on MRA brain. He has persistent vertigo and cerebellar ataxia, but mental status is preserved and neuro-imaging showed no cerebellar swelling, effacement of the fourth ventricle, or hydrocephalus . Poor adherence to anti lipid treatment. Will start aspirin 81 mg daily and complete stroke work up. PT consult. Stroke team to resume care in the morning and make further recommendations.    Stroke Risk Factors - hypertension, hyperlipidemia, heavy smoking.  Plan: 1. HgbA1c, fasting lipid panel 2. MRI, MRA  of the brain without contrast (done) 3. PT consult, OT consult, Speech consult 4. Echocardiogram 5. Carotid dopplers 6. Prophylactic therapy-aspirin 81 mg daily. 7. Risk factor modification 8. Telemetry monitoring 9. Frequent neuro checks   Wyatt Portela, MD [facility] [phone]  12/08/2012, 11:15 PM

## 2012-12-09 DIAGNOSIS — Z8249 Family history of ischemic heart disease and other diseases of the circulatory system: Secondary | ICD-10-CM

## 2012-12-09 DIAGNOSIS — I6350 Cerebral infarction due to unspecified occlusion or stenosis of unspecified cerebral artery: Secondary | ICD-10-CM

## 2012-12-09 LAB — GLUCOSE, CAPILLARY
Glucose-Capillary: 173 mg/dL — ABNORMAL HIGH (ref 70–99)
Glucose-Capillary: 160 mg/dL — ABNORMAL HIGH (ref 70–99)

## 2012-12-09 LAB — HEMOGLOBIN A1C: Mean Plasma Glucose: 171 mg/dL — ABNORMAL HIGH (ref <117)

## 2012-12-09 LAB — LIPID PANEL
LDL Cholesterol: 144 mg/dL — ABNORMAL HIGH (ref 0–99)
Total CHOL/HDL Ratio: 8.4 RATIO
VLDL: 77 mg/dL — ABNORMAL HIGH (ref 0–40)

## 2012-12-09 MED ORDER — HYDROCHLOROTHIAZIDE 12.5 MG PO CAPS
12.5000 mg | ORAL_CAPSULE | Freq: Every evening | ORAL | Status: DC
  Administered 2012-12-09 – 2012-12-10 (×2): 12.5 mg via ORAL
  Filled 2012-12-09 (×3): qty 1

## 2012-12-09 MED ORDER — NIACIN 500 MG PO TABS
500.0000 mg | ORAL_TABLET | Freq: Every day | ORAL | Status: DC
  Administered 2012-12-09 – 2012-12-10 (×2): 500 mg via ORAL
  Filled 2012-12-09 (×4): qty 1

## 2012-12-09 MED ORDER — SIMVASTATIN 20 MG PO TABS
20.0000 mg | ORAL_TABLET | Freq: Every day | ORAL | Status: DC
  Administered 2012-12-09 – 2012-12-10 (×2): 20 mg via ORAL
  Filled 2012-12-09 (×3): qty 1

## 2012-12-09 MED ORDER — ASPIRIN 325 MG PO TABS
325.0000 mg | ORAL_TABLET | Freq: Every day | ORAL | Status: DC
  Administered 2012-12-09 – 2012-12-11 (×3): 325 mg via ORAL
  Filled 2012-12-09 (×4): qty 1

## 2012-12-09 MED ORDER — INSULIN GLARGINE 100 UNIT/ML ~~LOC~~ SOLN
5.0000 [IU] | Freq: Every day | SUBCUTANEOUS | Status: DC
  Administered 2012-12-09 – 2012-12-10 (×2): 5 [IU] via SUBCUTANEOUS
  Filled 2012-12-09 (×5): qty 0.05

## 2012-12-09 MED ORDER — LOSARTAN POTASSIUM 50 MG PO TABS
50.0000 mg | ORAL_TABLET | Freq: Every evening | ORAL | Status: DC
  Administered 2012-12-09 – 2012-12-10 (×2): 50 mg via ORAL
  Filled 2012-12-09 (×3): qty 1

## 2012-12-09 NOTE — ED Provider Notes (Signed)
Medical screening examination/treatment/procedure(s) were conducted as a shared visit with non-physician practitioner(s) and myself.  I personally evaluated the patient during the encounter. Patient presents with vertigo. MRI was done and was found to have cerebellar strokes. He was admitted to internal medicine with a neurology consult.  Juliet Rude. Rubin Payor, MD 12/09/12 [id number]

## 2012-12-09 NOTE — Progress Notes (Signed)
Physical Therapy Evaluation Patient Details Name: Gregory Gordon MRN: [id number] DOB: 10/12/1960 Today's Date: 12/09/2012 Time: 7829-5621 PT Time Calculation (min): 25 min  PT Assessment / Plan / Recommendation Clinical Impression  Pt is 52 yo male with cerebellar CVA who presents with gait abnormailty and imbalance, denies dizziness at this point and no weakness noted. PT will follow acutely to help improve pt's balance with functional activity and recommend outpt PT at d/c.    PT Assessment  Patient needs continued PT services    Follow Up Recommendations  Outpatient PT;Supervision - Intermittent    Does the patient have the potential to tolerate intense rehabilitation      Barriers to Discharge None      Equipment Recommendations  None recommended by PT    Recommendations for Other Services     Frequency Min 4X/week    Precautions / Restrictions Precautions Precautions: Fall Restrictions Weight Bearing Restrictions: No   Pertinent Vitals/Pain No c/o pain      Mobility  Bed Mobility Bed Mobility: Supine to Sit;Sitting - Scoot to Edge of Bed;Sit to Supine Supine to Sit: 7: Independent Sitting - Scoot to Edge of Bed: 7: Independent Sit to Supine: 7: Independent Transfers Transfers: Sit to Stand;Stand to Sit Sit to Stand: 7: Independent;From bed Stand to Sit: 7: Independent;To bed Ambulation/Gait Ambulation/Gait Assistance: 4: Min guard Ambulation Distance (Feet): 400 Feet Assistive device: None Ambulation/Gait Assistance Details: pt with quick pace to help balance himself better, several LOB with self-correction or lean into therapists on either side. Pt has more difficulty with ambulation when he slows down his pace and when he turns his head. Was able to change directions quickly without LOB. Gait Pattern: Within Functional Limits Gait velocity: WFL Stairs: No Wheelchair Mobility Wheelchair Mobility: No Modified Rankin (Stroke Patients Only) Pre-Morbid  Rankin Score: No symptoms Modified Rankin: No significant disability    Exercises     PT Diagnosis: Abnormality of gait  PT Problem List: Decreased balance;Decreased mobility PT Treatment Interventions: Gait training;Stair training;Functional mobility training;Balance training;Therapeutic activities;Therapeutic exercise;Patient/family education   PT Goals Acute Rehab PT Goals PT Goal Formulation: With patient Time For Goal Achievement: 12/16/12 Potential to Achieve Goals: Good Pt will Ambulate: >150 feet;with modified independence;Other (comment) (with no LOB) PT Goal: Ambulate - Progress: Goal set today Pt will Go Up / Down Stairs: 6-9 stairs;with rail(s);with modified independence PT Goal: Up/Down Stairs - Progress: Goal set today Additional Goals Additional Goal #1: Pt to score >19 on DGI PT Goal: Additional Goal #1 - Progress: Goal set today  Visit Information  Last PT Received On: 12/09/12 Assistance Needed: +1 PT/OT Co-Evaluation/Treatment: Yes    Subjective Data  Subjective: pt wants to go home and has not been given MRI results yet Patient Stated Goal: return to home and work   Prior Functioning  Home Living Lives With: Spouse Available Help at Discharge: Family Type of Home: Mobile home Home Access: Stairs to enter Secretary/administrator of Steps: 6 Entrance Stairs-Rails: Right;Left;Can reach both Home Layout: One level Bathroom Shower/Tub: Health visitor: Standard Home Adaptive Equipment: None Prior Function Level of Independence: Independent Able to Take Stairs?: Yes Driving: Yes 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 Communication: No difficulties Dominant Hand: Right    Cognition  Cognition Overall Cognitive Status: Appears within functional limits for tasks assessed/performed Arousal/Alertness: Awake/alert Orientation Level: Appears intact for tasks assessed Behavior During Session: [facility] for  tasks performed    Extremity/Trunk Assessment Right Upper Extremity Assessment RUE  ROM/Strength/Tone: Within functional levels RUE Coordination: WFL - gross/fine motor Left Upper Extremity Assessment LUE ROM/Strength/Tone: Within functional levels LUE Coordination: WFL - gross/fine motor Right Lower Extremity Assessment RLE ROM/Strength/Tone: WFL for tasks assessed RLE Sensation: WFL - Light Touch;WFL - Proprioception RLE Coordination: WFL - gross/fine motor Left Lower Extremity Assessment LLE ROM/Strength/Tone: WFL for tasks assessed LLE Sensation: WFL - Light Touch;WFL - Proprioception LLE Coordination: WFL - gross/fine motor Trunk Assessment Trunk Assessment: Normal   Balance Balance Balance Assessed: Yes Standardized Balance Assessment Standardized Balance Assessment: Dynamic Gait Index Dynamic Gait Index Level Surface: Mild Impairment Change in Gait Speed: Moderate Impairment Gait with Horizontal Head Turns: Moderate Impairment Gait with Vertical Head Turns: Mild Impairment Gait and Pivot Turn: Mild Impairment  End of Session PT - End of Session Equipment Utilized During Treatment: Gait belt Activity Tolerance: Patient tolerated treatment well Patient left: in bed;with call bell/phone within reach;with bed alarm set;with family/visitor present Nurse Communication: Mobility status  GP   Lyanne Co, PT  Acute Rehab Services  [phone]   Lyanne Co 12/09/2012, 12:19 PM

## 2012-12-09 NOTE — Progress Notes (Signed)
Stroke Team Progress Note  HISTORY Gregory Gordon is an 52 y.o. male, right handed, with a past medical history significant for hypertension, hyperlipidemia with poor adherence to treatment, heavy smoker, who was in his usual state of heath until 12/05/12 when developed sudden onset of dizziness, imbalance, nausea, and vomiting. He doesn't recall having headache, double vision, difficulty swallowing, focal weakness, language or vision impairment. States he was mowing the grass and when he got off his lawn mower he lost his balance and fell to the ground.  Gregory Gordon expressed that he never had similar symptoms before, but ever since has been bothered by a spinning sensation and lack of balance every time he tries to stand up and walk. Did not seek immediate medical attention and waited until yesterday 12/07/2012 when he decided to see his primary care physician who prescribed Antivert. He did not get any better and thus came to the ED 12/08/2012 for further evaluation.   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 No significant mass effect. Possible petechial hemorrhage in the left cerebellar vermis.  MRA brain no bilateral PICA or AICA flow signal detected. BA and PCA are okay, as well the anterior circulation.  Patient was not a TPA candidate secondary to delay in arrival. He was admitted for further evaluation and treatment.  SUBJECTIVE His wife is at the bedside.  Overall he feels his condition is gradually improving. No history of clots in legs, lungs. Does have a family history of cardiac disease at a young age.  OBJECTIVE Most recent Vital Signs: Filed Vitals:   12/09/12 0215 12/09/12 0439 12/09/12 0643 12/09/12 0900  BP: 101/57 125/75 128/82 150/85  Pulse: 69 65 71 64  Temp: 98.3 F (36.8 C) 97.9 F (36.6 C) 98.2 F (36.8 C) 98.4 F (36.9 C)  TempSrc: Oral Oral Oral Oral  Resp: 16 18 16 22   Weight:      SpO2: 97% 100% 99% 100%   CBG  (last 3)   Recent Labs  12/09/12 0646  GLUCAP 173*    IV Fluid Intake:     MEDICATIONS  . aspirin EC  81 mg Oral Daily  . heparin  5,000 Units Subcutaneous Q8H  . losartan  50 mg Oral QPM   And  . hydrochlorothiazide  12.5 mg Oral QPM  . insulin aspart  0-15 Units Subcutaneous TID WC  . simvastatin  10 mg Oral q1800   PRN:    Diet:  Cardiac thin liquids Activity:   Up with assistance as tolerated DVT Prophylaxis:  Heparin 5000 units sq tid  CLINICALLY SIGNIFICANT STUDIES Basic Metabolic Panel:  Recent Labs Lab 12/08/12 1802  NA 142  K 4.1  CL 100  CO2 29  GLUCOSE 256*  BUN 26*  CREATININE 0.99  CALCIUM 10.4   Liver Function Tests: No results found for this basename: AST, ALT, ALKPHOS, BILITOT, PROT, ALBUMIN,  in the last 168 hours CBC:  Recent Labs Lab 12/08/12 1802  WBC 11.0*  HGB 16.4  HCT 45.6  MCV 83.2  PLT 257   Coagulation: No results found for this basename: LABPROT, INR,  in the last 168 hours Cardiac Enzymes: No results found for this basename: CKTOTAL, CKMB, CKMBINDEX, TROPONINI,  in the last 168 hours Urinalysis: No results found for this basename: COLORURINE, APPERANCEUR, LABSPEC, PHURINE, GLUCOSEU, HGBUR, BILIRUBINUR, KETONESUR, PROTEINUR, UROBILINOGEN, NITRITE, LEUKOCYTESUR,  in the last 168 hours Lipid Panel    Component Value Date/Time   CHOL 251*  12/08/2012 2340   TRIG 386* 12/08/2012 2340   HDL 30* 12/08/2012 2340   CHOLHDL 8.4 12/08/2012 2340   VLDL 77* 12/08/2012 2340   LDLCALC 144* 12/08/2012 2340   HgbA1C  No results found for this basename: HGBA1C    Urine Drug Screen:   No results found for this basename: labopia, cocainscrnur, labbenz, amphetmu, thcu, labbarb    Alcohol Level: No results found for this basename: ETH,  in the last 168 hours  CT of the brain  12/08/2012  Suboptimal inspiration.  No acute cardiopulmonary disease.    MRI of the brain  12/08/2012   1.  Bilateral acute on chronic cerebellar infarcts, left greater  than right. No significant mass effect.  Possible petechial hemorrhage in the left cerebellar vermis.  Evidence of left PICA or a PICA branch slow flow or occlusion. 2.  No other acute intracranial abnormality.  Intracranial artery dolichoectasia.  See MRA findings below.  3.  Mild to moderate for age nonspecific cerebral white matter signal changes, favor chronic small vessel disease.    MRA of the brain  12/08/2012  1.  No bilateral PICA or AICA flow signal detected.  Tortuous distal vertebral arteries appear to be patent without definite lesion. 2.  Otherwise negative posterior circulation. 3.  Mild to moderate ICA siphon tortuosity.  Mild bilateral MCA M2 branch irregularity. 4.  Otherwise negative anterior circulation.  2D Echocardiogram    Carotid Doppler    CXR    EKG  normal sinus rhythm.   Therapy Recommendations   Physical Exam   Pleasant  young Caucasian male currently not in distress.Awake alert. Afebrile. Head is nontraumatic. Neck is supple without bruit. Hearing is normal. Cardiac exam no murmur or gallop. Lungs are clear to auscultation. Distal pulses are well felt. Neurological Exam : Awake  Alert oriented x 3. Normal speech and language.eye movements full without nystagmus but mild sac headache dysmetria on lateral gaze bilaterally. Fundi were not visualized. Vision acuity and fields appear normal. Hearing is normal.. Face symmetric. Tongue midline. Normal strength, tone, reflexes and coordination. Normal sensation. Gait deferred. ASSESSMENT Gregory Gordon is a 52 y.o. male presenting with dizziness, imbalance, nausea, and vomiting.  Imaging confirms a left greater than right cerebellar  infarcts. Infarcts felt to be embolic secondary to unknown etiology.  On no antiplatelts prior to admission. Now on aspirin 81 mg orally every day for secondary stroke prevention. Patient with resultant ataxic gait, dizziness, nausea at times. Work up  underway.   Hypertension Hyperlipidemia, LDL 144, on statin PTA, on statin now, goal LDL < 100 Cigarette smoker Family hx MI < 27 - bro w/ secondary stroke at age 66, dad with heart problems as a young man  Hospital day # 1  TREATMENT/PLAN  Increase aspirin to aspirin 325 mg orally every day for secondary stroke prevention.  Increase statin check Hypercoagulable panel (minus factor 5 leiden and beta-2-glycoprotein as these test for venous clots) and vasculitic labs (C3, C4, CH50, ANA, ESR) and RPR, HIV Stop smoking TEE to look for embolic source. Scheduled with [facility] for tomorrow. If positive for PFO (patent foramen ovale), check bilateral lower extremity venous dopplers to rule out DVT as possible source of stroke.  If TEE unrevealing, please schedule outpatient telemetry monitoring to assess patient for atrial fibrillation as source of stroke. May be arranged with patient's cardiologist, or cardiologist of choice.  F/u HgbA1c, Carotids, 2D  Dr. Pearlean Brownie discussed with Dr. David Stall  Gregory BIBY,  MSN, RN, ANVP-BC, ANP-BC, GNP-BC [facility] Pager: [phone] 12/09/2012 9:53 AM  I have personally obtained a history, examined the patient, evaluated imaging results, and formulated the assessment and plan of care. I agree with the above.  Gregory Heady, MD

## 2012-12-09 NOTE — Evaluation (Signed)
Speech Language Pathology Evaluation Patient Details Name: Gregory Gordon MRN: [id number] DOB: 1960/10/19 Today's Date: 12/09/2012 Time: 7829-5621 SLP Time Calculation (min): 12 min  Problem List:  Patient Active Problem List  Diagnosis  . Arterial ischemic stroke, vertebrobasilar, cerebellar, acute  . DM2 (diabetes mellitus, type 2)  . Dyslipidemia  . HTN (hypertension)  . Family history of early CAD  . Q waves suggestive of previous myocardial infarction   Past Medical History:  Past Medical History  Diagnosis Date  . Hypertension   . Hyperlipemia    Past Surgical History:  Past Surgical History  Procedure Laterality Date  . Neg hx     HPI:  Gregory Gordon is a 52 y.o. male who presents to the ED with dizziness. Symptoms onset 4 days ago after mowing the grass outside. Quality is a "room spinning sensation" relieved by lying flat and motionless. Was associated nausea and vomiting which has since resolved. In ED work up demonstrated acute on chronic B cerebellar ischemic strokes.   Assessment / Plan / Recommendation Clinical Impression  Cognitive-linguistic evaluation complete. Cognitive-linguistic skills WFL and at baseline at this time. No SLP f/u indicated. Please reconsult if needed.     SLP Assessment  Patient does not need any further Speech Lanaguage Pathology Services    Follow Up Recommendations  None       Pertinent Vitals/Pain None reported       SLP Evaluation Prior Functioning  Cognitive/Linguistic Baseline: Within functional limits Lives With: Spouse   Cognition  Overall Cognitive Status: Appears within functional limits for tasks assessed Orientation Level: Oriented X4    Comprehension  Auditory Comprehension Overall Auditory Comprehension: Appears within functional limits for tasks assessed Visual Recognition/Discrimination Discrimination: Not tested Reading Comprehension Reading Status: Unable to assess (comment) (requires glasses which  are not available; suspect WFL)    Expression Expression Primary Mode of Expression: Verbal Verbal Expression Overall Verbal Expression: Appears within functional limits for tasks assessed   Oral / Motor Oral Motor/Sensory Function Overall Oral Motor/Sensory Function: Appears within functional limits for tasks assessed Motor Speech Overall Motor Speech: Appears within functional limits for tasks assessed   GO   Gregory Lango MA, CCC-SLP [phone]   Gregory Gordon Gregory Gordon 12/09/2012, 9:08 AM

## 2012-12-09 NOTE — Progress Notes (Signed)
[facility] PROGRESS NOTE Interim History: 52 y.o. male, right handed, with a past medical history significant for hypertension, hyperlipidemia with poor adherence to treatment, heavy smoker, who was in his usual state of heath until 12/05/12 when developed sudden onset of dizziness, imbalance, nausea, and vomiting. He doesn't recall having headache, double vision, difficulty swallowing, focal weakness, language or vision impairment. States he was mowing the grass and when he got off his lawn mower he lost his balance and fell to the ground   Assessment/Plan: Arterial ischemic stroke, vertebrobasilar, cerebellar, acute - HgbA1c: 7.6 , fasting lipid panel: LDL 144, HDl <40 - MRI, MRA: Bilateral acute on chronic cerebellar infarcts, left greater than right. No significant mass effect. - Possible petechial hemorrhage in the left cerebellar vermis.  - PT consult, OT consult: outpatient supervision. Speech eval no difficulties - Carotid dopplers/ TE Echocardiogram: pending. - Prophylactic therapy-Antiplatelet med: Aspirin - dose 325 mg PO daily  - Cardiac Monitoring: no events    DM2 (diabetes mellitus, type 2): - good control in-house. Change diet, low dose lantus. - metformin as an outpatient.    Dyslipidemia: - statins.    HTN (hypertension): - high on last measurement. - continue to monitor will allow certain degree of permissive HTN.   Code Status: Full Code  Family Communication: Spoke with family at bedside  Disposition Plan: Admit to inpatient    Consultants:  neurology  Procedures:  MRI  Echo  Carotid doppler  Antibiotics:  None  HPI/Subjective: Symptoms better  Objective: Filed Vitals:   12/09/12 0215 12/09/12 0439 12/09/12 0643 12/09/12 0900  BP: 101/57 125/75 128/82 150/85  Pulse: 69 65 71 64  Temp: 98.3 F (36.8 C) 97.9 F (36.6 C) 98.2 F (36.8 C) 98.4 F (36.9 C)  TempSrc: Oral Oral Oral Oral  Resp: 16 18 16 22   Height:    5\' 7"  (1.702  m)  Weight:      SpO2: 97% 100% 99% 100%   No intake or output data in the 24 hours ending 12/09/12 1503 Filed Weights   12/09/12 0034  Weight: 81.6 kg (179 lb 14.3 oz)    Exam:  General: Alert, awake, oriented x3, in no acute distress.  HEENT: No bruits, no goiter.  Heart: Regular rate and rhythm, without murmurs, rubs, gallops.  Lungs: Good air movement, bilateral air movement.  Abdomen: Soft, nontender, nondistended, positive bowel sounds.  Neuro: Grossly intact, nonfocal.   Data Reviewed: Basic Metabolic Panel:  Recent Labs Lab 12/08/12 1802  NA 142  K 4.1  CL 100  CO2 29  GLUCOSE 256*  BUN 26*  CREATININE 0.99  CALCIUM 10.4   Liver Function Tests: No results found for this basename: AST, ALT, ALKPHOS, BILITOT, PROT, ALBUMIN,  in the last 168 hours No results found for this basename: LIPASE, AMYLASE,  in the last 168 hours No results found for this basename: AMMONIA,  in the last 168 hours CBC:  Recent Labs Lab 12/08/12 1802  WBC 11.0*  HGB 16.4  HCT 45.6  MCV 83.2  PLT 257   Cardiac Enzymes: No results found for this basename: CKTOTAL, CKMB, CKMBINDEX, TROPONINI,  in the last 168 hours BNP (last 3 results) No results found for this basename: PROBNP,  in the last 8760 hours CBG:  Recent Labs Lab 12/09/12 0646 12/09/12 1120  GLUCAP 173* 160*    No results found for this or any previous visit (from the past 240 hour(s)).   Studies: Dg Chest 2 View  12/08/2012  *  RADIOLOGY REPORT*  Clinical Data: 4-day history of nausea and vomiting and dizziness. Smoker with current history of hypertension.  CHEST - 2 VIEW  Comparison: None.  Findings: Suboptimal inspiration accounts for crowded bronchovascular markings, especially in the lung bases, and accentuates the cardiac silhouette.  Taking this into account, cardiac silhouette upper normal in size.  Hilar and mediastinal contours otherwise unremarkable.  Lungs clear.  No pleural effusions.  Visualized bony  thorax intact.  IMPRESSION: Suboptimal inspiration.  No acute cardiopulmonary disease.   Original Report Authenticated By: Hulan Saas, M.D.    Mr [facility] Wo Contrast  12/08/2012  *RADIOLOGY REPORT*  Clinical Data:  52 year old male with dizziness since Saturday. Vertigo.  Chest pain, neck pain.  Comparison: None.  MRI HEAD WITHOUT CONTRAST  Technique: Multiplanar, multiecho pulse sequences of the brain and surrounding structures were obtained according to standard protocol without intravenous contrast.  Findings: There is a 3 cm area of densely restricted diffusion in the inferior left cerebellar hemisphere. The left cerebellar vermis is affected. There is a smaller 15 mm area of densely restricted diffusion in the inferior right cerebellar hemisphere.  There are scattered additional foci of restricted diffusion in the cerebellar hemispheres.  There are underlying chronic infarcts in both posterior cerebellar hemispheres (series 3 image 7  and series 3 100 image 7).  No definite acute or chronic infarcts in the brain stem. Major intracranial vascular flow voids are preserved with intracranial artery dolichoectasia noted. However, there is abnormal increased FLAIR signal and decreased T2 signal in the the left PICA or a branch of the left PICA (series 6 image 5 and series 8 image 5) compatible with a slow flow or thrombus.  There is adjacent either punctate petechial hemorrhage or additional intravascular thrombus near the left cerebellar vermis (series 8 image 5).  MRA findings are below.  Associated T2 and FLAIR hyperintensity plus T1 hypointensity in the acutely affected areas.  No significant mass effect. No other acute intracranial hemorrhage.  No supratentorial diffusion abnormality.  No ventriculomegaly. Negative pituitary.  Negative cervicomedullary junction and visualized cervical spine.  Scattered patchy cerebral white matter T2 and FLAIR hyperintensity, mild to moderate for age.  No supratentorial  encephalomalacia.  Visualized bone marrow signal is within normal limits.  Visualized orbit soft tissues are within normal limits.  Minor paranasal sinus mucosal thickening.  Mastoids are clear.  Negative visualized internal auditory structures.  Negative scalp soft tissues.  IMPRESSION: 1.  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 No significant mass effect.  Possible petechial hemorrhage in the left cerebellar vermis.  Evidence of left PICA or a PICA branch slow flow or occlusion. 2.  No other acute intracranial abnormality.  Intracranial artery dolichoectasia.  See MRA findings below.  3.  Mild to moderate for age nonspecific cerebral white matter signal changes, favor chronic small vessel disease.  MRA HEAD WITHOUT CONTRAST  Technique: Angiographic images of the Circle of Willis were obtained using MRA technique without  intravenous contrast.  Findings: Antegrade flow in codominant distal vertebral arteries. Moderate to severe tortuosity of the V4 segments and vertebrobasilar junction.  No definite stenosis; mild asymmetric signal intensity is felt related to the vessel tortuosity.  No PICA flow signal identified on either side.  No definite AICA flow signal.  No basilar artery stenosis.  SCA and PCA origins are within normal limits.  Posterior communicating arteries are diminutive or absent.  Bilateral PCA branches are within normal limits.  Antegrade flow in  both ICA siphons.  Mildly tortuous distal cervical right ICA.  Mild to moderate cavernous ICA tortuosity. Both ophthalmic artery origins are within normal limits.  Normal carotid termini, MCA and ACA origins.  Anterior communicating artery is diminutive or absent.  Tortuous proximal ACA branches. Bilateral MCA M1 segments are within normal limits.  Mild irregularity of the bilateral M2 segments.  Otherwise negative visualized bilateral MCA branches.  IMPRESSION: 1.  No bilateral PICA or AICA flow signal detected.  Tortuous  distal vertebral arteries appear to be patent without definite lesion. 2.  Otherwise negative posterior circulation. 3.  Mild to moderate ICA siphon tortuosity.  Mild bilateral MCA M2 branch irregularity. 4.  Otherwise negative anterior circulation.  Salient findings on this study discussed on 12/08/2012 at 2215 hours with PA Sharilyn Sites in the ED.   Original Report Authenticated By: Erskine Speed, M.D.    Mr Brain Wo Contrast  12/08/2012  *RADIOLOGY REPORT*  Clinical Data:  52 year old male with dizziness since Saturday. Vertigo.  Chest pain, neck pain.  Comparison: None.  MRI HEAD WITHOUT CONTRAST  Technique: Multiplanar, multiecho pulse sequences of the brain and surrounding structures were obtained according to standard protocol without intravenous contrast.  Findings: There is a 3 cm area of densely restricted diffusion in the inferior left cerebellar hemisphere. The left cerebellar vermis is affected. There is a smaller 15 mm area of densely restricted diffusion in the inferior right cerebellar hemisphere.  There are scattered additional foci of restricted diffusion in the cerebellar hemispheres.  There are underlying chronic infarcts in both posterior cerebellar hemispheres (series 3 image 7  and series 3 100 image 7).  No definite acute or chronic infarcts in the brain stem. Major intracranial vascular flow voids are preserved with intracranial artery dolichoectasia noted. However, there is abnormal increased FLAIR signal and decreased T2 signal in the the left PICA or a branch of the left PICA (series 6 image 5 and series 8 image 5) compatible with a slow flow or thrombus.  There is adjacent either punctate petechial hemorrhage or additional intravascular thrombus near the left cerebellar vermis (series 8 image 5).  MRA findings are below.  Associated T2 and FLAIR hyperintensity plus T1 hypointensity in the acutely affected areas.  No significant mass effect. No other acute intracranial hemorrhage.  No  supratentorial diffusion abnormality.  No ventriculomegaly. Negative pituitary.  Negative cervicomedullary junction and visualized cervical spine.  Scattered patchy cerebral white matter T2 and FLAIR hyperintensity, mild to moderate for age.  No supratentorial encephalomalacia.  Visualized bone marrow signal is within normal limits.  Visualized orbit soft tissues are within normal limits.  Minor paranasal sinus mucosal thickening.  Mastoids are clear.  Negative visualized internal auditory structures.  Negative scalp soft tissues.  IMPRESSION: 1.  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 No significant mass effect.  Possible petechial hemorrhage in the left cerebellar vermis.  Evidence of left PICA or a PICA branch slow flow or occlusion. 2.  No other acute intracranial abnormality.  Intracranial artery dolichoectasia.  See MRA findings below.  3.  Mild to moderate for age nonspecific cerebral white matter signal changes, favor chronic small vessel disease.  MRA HEAD WITHOUT CONTRAST  Technique: Angiographic images of the Circle of Willis were obtained using MRA technique without  intravenous contrast.  Findings: Antegrade flow in codominant distal vertebral arteries. Moderate to severe tortuosity of the V4 segments and vertebrobasilar junction.  No definite stenosis; mild asymmetric signal intensity is felt  related to the vessel tortuosity.  No PICA flow signal identified on either side.  No definite AICA flow signal.  No basilar artery stenosis.  SCA and PCA origins are within normal limits.  Posterior communicating arteries are diminutive or absent.  Bilateral PCA branches are within normal limits.  Antegrade flow in both ICA siphons.  Mildly tortuous distal cervical right ICA.  Mild to moderate cavernous ICA tortuosity. Both ophthalmic artery origins are within normal limits.  Normal carotid termini, MCA and ACA origins.  Anterior communicating artery is diminutive or absent.   Tortuous proximal ACA branches. Bilateral MCA M1 segments are within normal limits.  Mild irregularity of the bilateral M2 segments.  Otherwise negative visualized bilateral MCA branches.  IMPRESSION: 1.  No bilateral PICA or AICA flow signal detected.  Tortuous distal vertebral arteries appear to be patent without definite lesion. 2.  Otherwise negative posterior circulation. 3.  Mild to moderate ICA siphon tortuosity.  Mild bilateral MCA M2 branch irregularity. 4.  Otherwise negative anterior circulation.  Salient findings on this study discussed on 12/08/2012 at 2215 hours with PA Sharilyn Sites in the ED.   Original Report Authenticated By: Erskine Speed, M.D.     Scheduled Meds: . aspirin  325 mg Oral Daily  . heparin  5,000 Units Subcutaneous Q8H  . losartan  50 mg Oral QPM   And  . hydrochlorothiazide  12.5 mg Oral QPM  . insulin aspart  0-15 Units Subcutaneous TID WC  . simvastatin  20 mg Oral q1800   Continuous Infusions:    Marinda Elk  [facility] Pager [phone].  If 8PM-8AM, please contact night-coverage at [email or web address], password [facility] 12/09/2012, 3:03 PM  LOS: 1 day

## 2012-12-09 NOTE — Progress Notes (Signed)
VASCULAR LAB PRELIMINARY  PRELIMINARY  PRELIMINARY  PRELIMINARY  Carotid duplex  completed.    Preliminary report:  Bilateral:  No evidence of hemodynamically significant internal carotid artery stenosis.   Vertebral artery flow is antegrade.      Ahonesty Woodfin, RVT 12/09/2012, 2:18 PM

## 2012-12-09 NOTE — Progress Notes (Signed)
Inpatient Diabetes Program Recommendations  AACE/ADA: New Consensus Statement on Inpatient Glycemic Control (2013)  Target Ranges:  Prepandial:   less than 140 mg/dL      Peak postprandial:   less than 180 mg/dL (1-2 hours)      Critically ill patients:  140 - 180 mg/dL    MD, please check current HgbA1C. Pt has diabetes hx, but no home meds are listed. Need to assess home control. Thank you, Lenor Coffin, RN, CNS, Diabetes Coordinator [phone])

## 2012-12-09 NOTE — Progress Notes (Signed)
Utilization review completed. Ethan Kasperski, RN, BSN. 

## 2012-12-09 NOTE — Consult Note (Cosign Needed)
Gregory Wilson EdD 

## 2012-12-09 NOTE — Evaluation (Signed)
Occupational Therapy Evaluation Patient Details Name: Gregory Gordon MRN: [id number] DOB: 04-29-1961 Today's Date: 12/09/2012 Time: 4098-1191 OT Time Calculation (min): 23 min  OT Assessment / Plan / Recommendation Clinical Impression  Pt admitted with dizziness due to cerebellar CVA.  Presents with balance deficits.  Instructed in safety related to unsteady balance.  No further OT needs.    OT Assessment  Patient does not need any further OT services    Follow Up Recommendations  No OT follow up    Barriers to Discharge      Equipment Recommendations  None recommended by OT    Recommendations for Other Services    Frequency       Precautions / Restrictions Precautions Precautions: Fall Restrictions Weight Bearing Restrictions: No   Pertinent Vitals/Pain No pain    ADL  Eating/Feeding: Independent Where Assessed - Eating/Feeding: Bed level Grooming: Independent Where Assessed - Grooming: Unsupported standing Upper Body Bathing: Supervision/safety Equipment Used: Gait belt Transfers/Ambulation Related to ADLs: min guard assist for ambulation without device, supervision transfers ADL Comments: Pt is performing ADL with supervision to modified independent.  Instructed pt not to carry items with both hand when walking and to lean or sit with showering as balance will be compromised with eyes closed.      OT Diagnosis:    OT Problem List:   OT Treatment Interventions:     OT Goals    Visit Information  Last OT Received On: 12/09/12 Assistance Needed: +1 PT/OT Co-Evaluation/Treatment: Yes    Subjective Data  Subjective: I've been laying around since Saturday.   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 Communication: No difficulties Dominant Hand: Right         Vision/Perception Vision - History Baseline Vision: Wears glasses all the time Patient Visual Report: No change from baseline   Cognition  Cognition Overall Cognitive Status: Appears within functional limits for tasks assessed/performed Arousal/Alertness: Awake/alert Orientation Level: Appears intact for tasks assessed Behavior During Session: [facility] for tasks performed    Extremity/Trunk Assessment Right Upper Extremity Assessment RUE ROM/Strength/Tone: Within functional levels RUE Coordination: WFL - gross/fine motor Left Upper Extremity Assessment LUE ROM/Strength/Tone: Within functional levels LUE Coordination: WFL - gross/fine motor Right Lower Extremity Assessment RLE ROM/Strength/Tone: WFL for tasks assessed RLE Sensation: WFL - Light Touch;WFL - Proprioception RLE Coordination: WFL - gross/fine motor Left Lower Extremity Assessment LLE ROM/Strength/Tone: WFL for tasks assessed LLE Sensation: WFL - Light Touch;WFL - Proprioception LLE Coordination: WFL - gross/fine motor Trunk Assessment Trunk Assessment: Normal     Mobility Bed Mobility Bed Mobility: Supine to Sit;Sitting - Scoot to Edge of Bed;Sit to Supine Supine to Sit: 7: Independent Sitting - Scoot to Edge of Bed: 7: Independent Sit to Supine: 7: Independent Transfers Transfers: Sit to Stand;Stand to Sit Sit to Stand: 7: Independent;From bed Stand to Sit: 7: Independent;To bed     Exercise     Balance Balance Balance Assessed: Yes Standardized Balance Assessment Standardized Balance Assessment: Dynamic Gait Index Dynamic Gait Index Level Surface: Mild Impairment Change in Gait Speed: Moderate Impairment Gait with Horizontal  Head Turns: Moderate Impairment Gait  with Vertical Head Turns: Mild Impairment Gait and Pivot Turn: Mild Impairment   End of Session OT - End of Session Activity Tolerance: Patient tolerated treatment well Patient left: in bed;with call bell/phone within reach;with bed alarm set;with family/visitor present Nurse Communication: Mobility status  GO     Evern Bio 12/09/2012, 1:17 PM [phone]

## 2012-12-09 NOTE — Progress Notes (Signed)
Referral- Outpatient Physical Therapy, Medication Assistance  Discussed with pt medications. States he takes Hyzaar and his out of pocket cost is $45 at [facility]. [facility] has Antivert $35 for month supply, Hyzaar $57 and Pravastatin $15.00. Pt states he does work and was able to afford the Hyzaar out of pocket. NCM will continue to follow until dc to arrange Outpt PT if ordered at dc. Isidoro Donning RN CCM Case Mgmt phone [phone]

## 2012-12-10 ENCOUNTER — Encounter (HOSPITAL_COMMUNITY)
Admission: EM | Disposition: A | Discharge status: Home / Self Care | Payer: Self-pay | Source: Home / Self Care | Attending: Internal Medicine

## 2012-12-10 ENCOUNTER — Encounter (HOSPITAL_COMMUNITY): Payer: Self-pay | Admitting: Cardiology

## 2012-12-10 DIAGNOSIS — I6789 Other cerebrovascular disease: Secondary | ICD-10-CM

## 2012-12-10 DIAGNOSIS — I6350 Cerebral infarction due to unspecified occlusion or stenosis of unspecified cerebral artery: Secondary | ICD-10-CM

## 2012-12-10 HISTORY — PX: TEE WITHOUT CARDIOVERSION: SHX5443

## 2012-12-10 LAB — C3 COMPLEMENT: C3 Complement: 147 mg/dL (ref 90–180)

## 2012-12-10 LAB — CARDIOLIPIN ANTIBODIES, IGG, IGM, IGA
Anticardiolipin IgA: 4 APL U/mL — ABNORMAL LOW (ref <22)
Anticardiolipin IgG: 4 GPL U/mL — ABNORMAL LOW (ref <23)
Anticardiolipin IgM: 0 MPL U/mL — ABNORMAL LOW (ref <11)

## 2012-12-10 LAB — GLUCOSE, CAPILLARY
Glucose-Capillary: 176 mg/dL — ABNORMAL HIGH (ref 70–99)
Glucose-Capillary: 160 mg/dL — ABNORMAL HIGH (ref 70–99)

## 2012-12-10 LAB — PROTEIN C, TOTAL: Protein C, Total: 139 % (ref 72–160)

## 2012-12-10 LAB — C4 COMPLEMENT: Complement C4, Body Fluid: 29 mg/dL (ref 10–40)

## 2012-12-10 LAB — ANA: Anti Nuclear Antibody(ANA): NEGATIVE

## 2012-12-10 LAB — PROTEIN S ACTIVITY: Protein S Activity: 114 % (ref 69–129)

## 2012-12-10 LAB — PROTEIN S, TOTAL: Protein S Ag, Total: 116 % (ref 60–150)

## 2012-12-10 LAB — HOMOCYSTEINE: Homocysteine: 15 umol/L (ref 4.0–15.4)

## 2012-12-10 LAB — LUPUS ANTICOAGULANT PANEL: PTT Lupus Anticoagulant: 30.9 secs (ref 28.0–43.0)

## 2012-12-10 LAB — PROTEIN C ACTIVITY: Protein C Activity: 200 % — ABNORMAL HIGH (ref 75–133)

## 2012-12-10 SURGERY — ECHOCARDIOGRAM, TRANSESOPHAGEAL
Anesthesia: Moderate Sedation

## 2012-12-10 MED ORDER — BUTAMBEN-TETRACAINE-BENZOCAINE 2-2-14 % EX AERO
INHALATION_SPRAY | CUTANEOUS | Status: DC | PRN
  Administered 2012-12-10: 2 via TOPICAL

## 2012-12-10 MED ORDER — FENTANYL CITRATE 0.05 MG/ML IJ SOLN
INTRAMUSCULAR | Status: AC
  Filled 2012-12-10: qty 2

## 2012-12-10 MED ORDER — MIDAZOLAM HCL 10 MG/2ML IJ SOLN
INTRAMUSCULAR | Status: DC | PRN
  Administered 2012-12-10 (×2): 2 mg via INTRAVENOUS

## 2012-12-10 MED ORDER — SODIUM CHLORIDE 0.9 % IV SOLN
INTRAVENOUS | Status: DC
  Administered 2012-12-10: 13:00:00 via INTRAVENOUS

## 2012-12-10 MED ORDER — MIDAZOLAM HCL 5 MG/ML IJ SOLN
INTRAMUSCULAR | Status: AC
  Filled 2012-12-10: qty 2

## 2012-12-10 MED ORDER — FENTANYL CITRATE 0.05 MG/ML IJ SOLN
INTRAMUSCULAR | Status: DC | PRN
  Administered 2012-12-10 (×2): 25 ug via INTRAVENOUS

## 2012-12-10 NOTE — Progress Notes (Signed)
[facility] PROGRESS NOTE Interim History: 52 y.o. male, right handed, with a past medical history significant for hypertension, hyperlipidemia with poor adherence to treatment, heavy smoker, who was in his usual state of heath until 12/05/12 when developed sudden onset of dizziness, imbalance, nausea, and vomiting. He doesn't recall having headache, double vision, difficulty swallowing, focal weakness, language or vision impairment. States he was mowing the grass and when he got off his lawn mower he lost his balance and fell to the ground   Assessment/Plan: Arterial ischemic stroke, vertebrobasilar, cerebellar, acute - HgbA1c: 7.6 , fasting lipid panel: LDL 144, HDl <40 - MRI, MRA: Bilateral acute on chronic cerebellar infarcts, left greater than right. No significant mass effect. - Possible petechial hemorrhage in the left cerebellar vermis.  - PT consult, OT consult: outpatient supervision. Speech eval no difficulties - Carotid dopplers no ICA/ TTEchocardiogram: 55% to 60%. Wall motion was normal; there were no regional wall motion abnormalities. Doppler parameters are consistent with abnormal left ventricular relaxation. - TEE showed: positive saline microcavitation study, get doppler of lower extremity. - Prophylactic therapy-Antiplatelet med: Aspirin - dose 325 mg PO daily  - Cardiac Monitoring: no events    DM2 (diabetes mellitus, type 2): - good control in-house. Change diet, low dose lantus. - metformin as an outpatient.    Dyslipidemia: - statins.    HTN (hypertension): - high on last measurement. - continue to monitor will allow certain degree of permissive HTN.   Code Status: Full Code  Family Communication: Spoke with family at bedside  Disposition Plan: Admit to inpatient    Consultants:  neurology  Procedures:  MRI  Echo  Carotid doppler  Antibiotics:  None  HPI/Subjective: No complains  Objective: Filed Vitals:   12/09/12 2219 12/10/12  0218 12/10/12 0543 12/10/12 1233  BP: 143/96 140/92 133/83 174/76  Pulse: 64 62 65   Temp: 98.5 F (36.9 C) 98 F (36.7 C) 98.5 F (36.9 C) 98.5 F (36.9 C)  TempSrc: Oral Oral Oral Oral  Resp: 18 17 18 12   Height:      Weight:      SpO2: 97% 99% 98% 96%   No intake or output data in the 24 hours ending 12/10/12 1253 Filed Weights   12/09/12 0034  Weight: 81.6 kg (179 lb 14.3 oz)    Exam:  General: Alert, awake, oriented x3, in no acute distress.  HEENT: No bruits, no goiter.  Heart: Regular rate and rhythm, without murmurs, rubs, gallops.  Lungs: Good air movement, bilateral air movement.  Abdomen: Soft, nontender, nondistended, positive bowel sounds.  Neuro: Grossly intact, nonfocal.   Data Reviewed: Basic Metabolic Panel:  Recent Labs Lab 12/08/12 1802  NA 142  K 4.1  CL 100  CO2 29  GLUCOSE 256*  BUN 26*  CREATININE 0.99  CALCIUM 10.4   Liver Function Tests: No results found for this basename: AST, ALT, ALKPHOS, BILITOT, PROT, ALBUMIN,  in the last 168 hours No results found for this basename: LIPASE, AMYLASE,  in the last 168 hours No results found for this basename: AMMONIA,  in the last 168 hours CBC:  Recent Labs Lab 12/08/12 1802  WBC 11.0*  HGB 16.4  HCT 45.6  MCV 83.2  PLT 257   Cardiac Enzymes: No results found for this basename: CKTOTAL, CKMB, CKMBINDEX, TROPONINI,  in the last 168 hours BNP (last 3 results) No results found for this basename: PROBNP,  in the last 8760 hours CBG:  Recent Labs Lab 12/09/12 1120  12/09/12 1627 12/09/12 2222 12/10/12 0700 12/10/12 1200  GLUCAP 160* 207* 176* 171* 160*    No results found for this or any previous visit (from the past 240 hour(s)).   Studies: Dg Chest 2 View  12/08/2012  *RADIOLOGY REPORT*  Clinical Data: 4-day history of nausea and vomiting and dizziness. Smoker with current history of hypertension.  CHEST - 2 VIEW  Comparison: None.  Findings: Suboptimal inspiration accounts for  crowded bronchovascular markings, especially in the lung bases, and accentuates the cardiac silhouette.  Taking this into account, cardiac silhouette upper normal in size.  Hilar and mediastinal contours otherwise unremarkable.  Lungs clear.  No pleural effusions.  Visualized bony thorax intact.  IMPRESSION: Suboptimal inspiration.  No acute cardiopulmonary disease.   Original Report Authenticated By: Hulan Saas, M.D.    Mr [facility] Wo Contrast  12/08/2012  *RADIOLOGY REPORT*  Clinical Data:  52 year old male with dizziness since Saturday. Vertigo.  Chest pain, neck pain.  Comparison: None.  MRI HEAD WITHOUT CONTRAST  Technique: Multiplanar, multiecho pulse sequences of the brain and surrounding structures were obtained according to standard protocol without intravenous contrast.  Findings: There is a 3 cm area of densely restricted diffusion in the inferior left cerebellar hemisphere. The left cerebellar vermis is affected. There is a smaller 15 mm area of densely restricted diffusion in the inferior right cerebellar hemisphere.  There are scattered additional foci of restricted diffusion in the cerebellar hemispheres.  There are underlying chronic infarcts in both posterior cerebellar hemispheres (series 3 image 7  and series 3 100 image 7).  No definite acute or chronic infarcts in the brain stem. Major intracranial vascular flow voids are preserved with intracranial artery dolichoectasia noted. However, there is abnormal increased FLAIR signal and decreased T2 signal in the the left PICA or a branch of the left PICA (series 6 image 5 and series 8 image 5) compatible with a slow flow or thrombus.  There is adjacent either punctate petechial hemorrhage or additional intravascular thrombus near the left cerebellar vermis (series 8 image 5).  MRA findings are below.  Associated T2 and FLAIR hyperintensity plus T1 hypointensity in the acutely affected areas.  No significant mass effect. No other acute  intracranial hemorrhage.  No supratentorial diffusion abnormality.  No ventriculomegaly. Negative pituitary.  Negative cervicomedullary junction and visualized cervical spine.  Scattered patchy cerebral white matter T2 and FLAIR hyperintensity, mild to moderate for age.  No supratentorial encephalomalacia.  Visualized bone marrow signal is within normal limits.  Visualized orbit soft tissues are within normal limits.  Minor paranasal sinus mucosal thickening.  Mastoids are clear.  Negative visualized internal auditory structures.  Negative scalp soft tissues.  IMPRESSION: 1.  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 No significant mass effect.  Possible petechial hemorrhage in the left cerebellar vermis.  Evidence of left PICA or a PICA branch slow flow or occlusion. 2.  No other acute intracranial abnormality.  Intracranial artery dolichoectasia.  See MRA findings below.  3.  Mild to moderate for age nonspecific cerebral white matter signal changes, favor chronic small vessel disease.  MRA HEAD WITHOUT CONTRAST  Technique: Angiographic images of the Circle of Willis were obtained using MRA technique without  intravenous contrast.  Findings: Antegrade flow in codominant distal vertebral arteries. Moderate to severe tortuosity of the V4 segments and vertebrobasilar junction.  No definite stenosis; mild asymmetric signal intensity is felt related to the vessel tortuosity.  No PICA flow signal identified on  either side.  No definite AICA flow signal.  No basilar artery stenosis.  SCA and PCA origins are within normal limits.  Posterior communicating arteries are diminutive or absent.  Bilateral PCA branches are within normal limits.  Antegrade flow in both ICA siphons.  Mildly tortuous distal cervical right ICA.  Mild to moderate cavernous ICA tortuosity. Both ophthalmic artery origins are within normal limits.  Normal carotid termini, MCA and ACA origins.  Anterior communicating artery  is diminutive or absent.  Tortuous proximal ACA branches. Bilateral MCA M1 segments are within normal limits.  Mild irregularity of the bilateral M2 segments.  Otherwise negative visualized bilateral MCA branches.  IMPRESSION: 1.  No bilateral PICA or AICA flow signal detected.  Tortuous distal vertebral arteries appear to be patent without definite lesion. 2.  Otherwise negative posterior circulation. 3.  Mild to moderate ICA siphon tortuosity.  Mild bilateral MCA M2 branch irregularity. 4.  Otherwise negative anterior circulation.  Salient findings on this study discussed on 12/08/2012 at 2215 hours with PA Sharilyn Sites in the ED.   Original Report Authenticated By: Erskine Speed, M.D.    Mr Brain Wo Contrast  12/08/2012  *RADIOLOGY REPORT*  Clinical Data:  52 year old male with dizziness since Saturday. Vertigo.  Chest pain, neck pain.  Comparison: None.  MRI HEAD WITHOUT CONTRAST  Technique: Multiplanar, multiecho pulse sequences of the brain and surrounding structures were obtained according to standard protocol without intravenous contrast.  Findings: There is a 3 cm area of densely restricted diffusion in the inferior left cerebellar hemisphere. The left cerebellar vermis is affected. There is a smaller 15 mm area of densely restricted diffusion in the inferior right cerebellar hemisphere.  There are scattered additional foci of restricted diffusion in the cerebellar hemispheres.  There are underlying chronic infarcts in both posterior cerebellar hemispheres (series 3 image 7  and series 3 100 image 7).  No definite acute or chronic infarcts in the brain stem. Major intracranial vascular flow voids are preserved with intracranial artery dolichoectasia noted. However, there is abnormal increased FLAIR signal and decreased T2 signal in the the left PICA or a branch of the left PICA (series 6 image 5 and series 8 image 5) compatible with a slow flow or thrombus.  There is adjacent either punctate petechial  hemorrhage or additional intravascular thrombus near the left cerebellar vermis (series 8 image 5).  MRA findings are below.  Associated T2 and FLAIR hyperintensity plus T1 hypointensity in the acutely affected areas.  No significant mass effect. No other acute intracranial hemorrhage.  No supratentorial diffusion abnormality.  No ventriculomegaly. Negative pituitary.  Negative cervicomedullary junction and visualized cervical spine.  Scattered patchy cerebral white matter T2 and FLAIR hyperintensity, mild to moderate for age.  No supratentorial encephalomalacia.  Visualized bone marrow signal is within normal limits.  Visualized orbit soft tissues are within normal limits.  Minor paranasal sinus mucosal thickening.  Mastoids are clear.  Negative visualized internal auditory structures.  Negative scalp soft tissues.  IMPRESSION: 1.  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 No significant mass effect.  Possible petechial hemorrhage in the left cerebellar vermis.  Evidence of left PICA or a PICA branch slow flow or occlusion. 2.  No other acute intracranial abnormality.  Intracranial artery dolichoectasia.  See MRA findings below.  3.  Mild to moderate for age nonspecific cerebral white matter signal changes, favor chronic small vessel disease.  MRA HEAD WITHOUT CONTRAST  Technique: Angiographic images of the  Circle of Willis were obtained using MRA technique without  intravenous contrast.  Findings: Antegrade flow in codominant distal vertebral arteries. Moderate to severe tortuosity of the V4 segments and vertebrobasilar junction.  No definite stenosis; mild asymmetric signal intensity is felt related to the vessel tortuosity.  No PICA flow signal identified on either side.  No definite AICA flow signal.  No basilar artery stenosis.  SCA and PCA origins are within normal limits.  Posterior communicating arteries are diminutive or absent.  Bilateral PCA branches are within normal  limits.  Antegrade flow in both ICA siphons.  Mildly tortuous distal cervical right ICA.  Mild to moderate cavernous ICA tortuosity. Both ophthalmic artery origins are within normal limits.  Normal carotid termini, MCA and ACA origins.  Anterior communicating artery is diminutive or absent.  Tortuous proximal ACA branches. Bilateral MCA M1 segments are within normal limits.  Mild irregularity of the bilateral M2 segments.  Otherwise negative visualized bilateral MCA branches.  IMPRESSION: 1.  No bilateral PICA or AICA flow signal detected.  Tortuous distal vertebral arteries appear to be patent without definite lesion. 2.  Otherwise negative posterior circulation. 3.  Mild to moderate ICA siphon tortuosity.  Mild bilateral MCA M2 branch irregularity. 4.  Otherwise negative anterior circulation.  Salient findings on this study discussed on 12/08/2012 at 2215 hours with PA Sharilyn Sites in the ED.   Original Report Authenticated By: Erskine Speed, M.D.     Scheduled Meds: . aspirin  325 mg Oral Daily  . heparin  5,000 Units Subcutaneous Q8H  . losartan  50 mg Oral QPM   And  . hydrochlorothiazide  12.5 mg Oral QPM  . insulin aspart  0-15 Units Subcutaneous TID WC  . insulin glargine  5 Units Subcutaneous QHS  . niacin  500 mg Oral QHS  . simvastatin  20 mg Oral q1800   Continuous Infusions: . sodium chloride 15 mL/hr at 12/10/12 1238     FELIZ Rosine Beat  [facility] Pager [phone].  If 8PM-8AM, please contact night-coverage at [email or web address], password [facility] 12/10/2012, 12:53 PM  LOS: 2 days

## 2012-12-10 NOTE — Progress Notes (Signed)
  Echocardiogram Echocardiogram Transesophageal has been performed.  Gregory Gordon 12/10/2012, 1:57 PM

## 2012-12-10 NOTE — CV Procedure (Signed)
See full TEE report in [employer]; normal LV function; atrial septal aneurysm; mild-moderate atherosclerosis descending aorta; positive saline microcavitation study. [name]

## 2012-12-10 NOTE — Progress Notes (Signed)
  Echocardiogram 2D Echocardiogram has been performed.  Chianne Byrns 12/10/2012, 8:54 AM

## 2012-12-10 NOTE — Progress Notes (Signed)
Stroke Team Progress Note  HISTORY Gregory Gordon is an 52 y.o. male, right handed, with a past medical history significant for hypertension, hyperlipidemia with poor adherence to treatment, heavy smoker, who was in his usual state of heath until 12/05/12 when developed sudden onset of dizziness, imbalance, nausea, and vomiting. He doesn't recall having headache, double vision, difficulty swallowing, focal weakness, language or vision impairment. States he was mowing the grass and when he got off his lawn mower he lost his balance and fell to the ground.  Gregory Gordon expressed that he never had similar symptoms before, but ever since has been bothered by a spinning sensation and lack of balance every time he tries to stand up and walk. Did not seek immediate medical attention and waited until yesterday 12/07/2012 when he decided to see his primary care physician who prescribed Antivert. He did not get any better and thus came to the ED 12/08/2012 for further evaluation.   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 No significant mass effect. Possible petechial hemorrhage in the left cerebellar vermis.  MRA brain no bilateral PICA or AICA flow signal detected. BA and PCA are okay, as well the anterior circulation.  Patient was not a TPA candidate secondary to delay in arrival. He was admitted for further evaluation and treatment.  SUBJECTIVE Multiple family members at the bedside, including his wife.  OBJECTIVE Most recent Vital Signs: Filed Vitals:   12/09/12 1851 12/09/12 2219 12/10/12 0218 12/10/12 0543  BP: 146/89 143/96 140/92 133/83  Pulse: 65 64 62 65  Temp: 98.5 F (36.9 C) 98.5 F (36.9 C) 98 F (36.7 C) 98.5 F (36.9 C)  TempSrc: Oral Oral Oral Oral  Resp: 17 18 17 18   Height:      Weight:      SpO2: 97% 97% 99% 98%   CBG (last 3)   Recent Labs  12/09/12 1627 12/09/12 2222 12/10/12 0700  GLUCAP 207* 176* 171*   IV Fluid  Intake:     MEDICATIONS  . aspirin  325 mg Oral Daily  . heparin  5,000 Units Subcutaneous Q8H  . losartan  50 mg Oral QPM   And  . hydrochlorothiazide  12.5 mg Oral QPM  . insulin aspart  0-15 Units Subcutaneous TID WC  . insulin glargine  5 Units Subcutaneous QHS  . niacin  500 mg Oral QHS  . simvastatin  20 mg Oral q1800   PRN:    Diet:  NPO  Activity:   Up with assistance as tolerated DVT Prophylaxis:  Heparin 5000 units sq tid  CLINICALLY SIGNIFICANT STUDIES Basic Metabolic Panel:   Recent Labs Lab 12/08/12 1802  NA 142  K 4.1  CL 100  CO2 29  GLUCOSE 256*  BUN 26*  CREATININE 0.99  CALCIUM 10.4   Liver Function Tests: No results found for this basename: AST, ALT, ALKPHOS, BILITOT, PROT, ALBUMIN,  in the last 168 hours CBC:   Recent Labs Lab 12/08/12 1802  WBC 11.0*  HGB 16.4  HCT 45.6  MCV 83.2  PLT 257   Coagulation: No results found for this basename: LABPROT, INR,  in the last 168 hours Cardiac Enzymes: No results found for this basename: CKTOTAL, CKMB, CKMBINDEX, TROPONINI,  in the last 168 hours Urinalysis: No results found for this basename: COLORURINE, APPERANCEUR, LABSPEC, PHURINE, GLUCOSEU, HGBUR, BILIRUBINUR, KETONESUR, PROTEINUR, UROBILINOGEN, NITRITE, LEUKOCYTESUR,  in the last 168 hours Lipid Panel    Component Value Date/Time  CHOL 251* 12/08/2012 2340   TRIG 386* 12/08/2012 2340   HDL 30* 12/08/2012 2340   CHOLHDL 8.4 12/08/2012 2340   VLDL 77* 12/08/2012 2340   LDLCALC 144* 12/08/2012 2340   HgbA1C  Lab Results  Component Value Date   HGBA1C 7.6* 12/09/2012   Urine Drug Screen:   No results found for this basename: labopia,  cocainscrnur,  labbenz,  amphetmu,  thcu,  labbarb    Alcohol Level: No results found for this basename: ETH,  in the last 168 hours  Hypercoagulable Workup Normal - C3, C4, ANA, RPR, ESR, HIV, Protein C & S total, homocysteine, cardiolipin antibody Pending - CH50, Prot C & S activity, lupus anticoagulant,   Anti III - 127 (h)   CT of the brain  12/08/2012  Suboptimal inspiration.  No acute cardiopulmonary disease.    MRI of the brain  12/08/2012   1.  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 No significant mass effect.  Possible petechial hemorrhage in the left cerebellar vermis.  Evidence of left PICA or a PICA branch slow flow or occlusion. 2.  No other acute intracranial abnormality.  Intracranial artery dolichoectasia.  See MRA findings below.  3.  Mild to moderate for age nonspecific cerebral white matter signal changes, favor chronic small vessel disease.    MRA of the brain  12/08/2012  1.  No bilateral PICA or AICA flow signal detected.  Tortuous distal vertebral arteries appear to be patent without definite lesion. 2.  Otherwise negative posterior circulation. 3.  Mild to moderate ICA siphon tortuosity.  Mild bilateral MCA M2 branch irregularity. 4.  Otherwise negative anterior circulation.  2D Echocardiogram  EF 55-60% with no source of embolus.   Carotid Doppler  No evidence of hemodynamically significant internal carotid artery stenosis. Vertebral artery flow is antegrade.   CXR    EKG  normal sinus rhythm.   Therapy Recommendations home health PT, no OT needs  Physical Exam   Pleasant  young Caucasian male currently not in distress.Awake alert. Afebrile. Head is nontraumatic. Neck is supple without bruit. Hearing is normal. Cardiac exam no murmur or gallop. Lungs are clear to auscultation. Distal pulses are well felt. Neurological Exam : Awake  Alert oriented x 3. Normal speech and language.eye movements full without nystagmus but mild sac headache dysmetria on lateral gaze bilaterally. Fundi were not visualized. Vision acuity and fields appear normal. Hearing is normal.. Face symmetric. Tongue midline. Normal strength, tone, reflexes and coordination. Normal sensation. Gait deferred.  ASSESSMENT Gregory Gordon is a 52 y.o. male presenting with  dizziness, imbalance, nausea, and vomiting.  Imaging confirms a left greater than right cerebellar  infarcts. Infarcts felt to be embolic secondary to unknown etiology.  On no antiplatelts prior to admission. Now on aspirin 325 mg orally every day for secondary stroke prevention. Patient with resultant ataxic gait, dizziness, nausea at times. Work up underway.   Hypertension Hyperlipidemia, LDL 144, on statin PTA, on statin now, goal LDL < 100 Cigarette smoker Family hx MI < 62 - bro w/ secondary stroke at age 31, dad with heart problems as a young man  Hospital day # 2  TREATMENT/PLAN  Continue aspirin 325 mg orally every day for secondary stroke prevention. F/u CH50, Prot C & S activity, lupus anticoagulant Stop smoking TEE today to look for embolic source. Scheduled with [facility]. If positive for PFO (patent foramen ovale), check bilateral lower extremity venous dopplers to rule out DVT  as possible source of stroke.  If TEE unrevealing, please schedule outpatient telemetry monitoring to assess patient for atrial fibrillation as source of stroke. May be arranged with patient's cardiologist, or cardiologist of choice.   Dr. Pearlean Brownie discussed with Dr. Judd Gaudier, MSN, RN, ANVP-BC, ANP-BC, GNP-BC [facility] Pager: [phone] 12/10/2012 11:27 AM  I have personally obtained a history, examined the patient, evaluated imaging results, and formulated the assessment and plan of care. I agree with the above.  Delia Heady, MD

## 2012-12-10 NOTE — Progress Notes (Signed)
Pt. refusing alarms and assistance with ambulation. Education and reassurance provided. Pt states he is aware he is at risk for falling and has dizziness and  "balance trouble, but that he feels "fine" and does not need assistance. Pt insists on stretching on couch. Door left open and family in room. Will continue to monitor closely and continue to encourage compliance with safety measures.

## 2012-12-10 NOTE — Care Management Note (Signed)
    Page 1 of 2   12/10/2012     4:59:33 PM   CARE MANAGEMENT NOTE 12/10/2012  Patient:  Gregory Gordon, Gregory Gordon   Account Number:  [id number]  Date Initiated:  12/09/2012  Documentation initiated by:  Jacquelynn Cree  Subjective/Objective Assessment:   admitted with CVA     Action/Plan:   PT/OT evals-recommending HHPT/OT   Anticipated DC Date:  12/13/2011   Anticipated DC Plan:  HOME W HOME HEALTH SERVICES      DC Planning Services  CM consult      Choice offered to / List presented to:          [facility] arranged  HH-2 PT      [facility] agency  [facility]   Status of service:  Completed, signed off Medicare Important Message given?   (If response is "NO", the following Medicare IM given date fields will be blank) Date Medicare IM given:   Date Additional Medicare IM given:    Discharge Disposition:  HOME W HOME HEALTH SERVICES  Per UR Regulation:  Reviewed for med. necessity/level of care/duration of stay  If discussed at Long Length of Stay Meetings, dates discussed:    Comments:  12/10/12 PT recommending HHPT instead of outpatient. Spoke with patient and he agreed to HHPT with [facility]. Contacted Sherrick Araki at [facility] and set up HHPT. Patient has a PCP Dr. Loma Sender in [place]. Jacquelynn Cree RN, BSN, [facility]     12/09/2012 5:10 PM Referral- Outpatient Physical Therapy, Medication Assistance Discussed with pt medications. States he takes Hyzaar and his out of pocket cost is $45 at [facility]. [facility] has Antivert $35 for month supply, Hyzaar $57 and Pravastatin $15.00. Pt states he does work and was able to afford the Hyzaar out of pocket. NCM will continue to follow until dc to arrange Outpt PT if ordered at dc. Isidoro Donning RN CCM Case Mgmt phone [phone]

## 2012-12-10 NOTE — H&P (View-Only) (Signed)
[facility] PROGRESS NOTE Interim History: 52 y.o. male, right handed, with a past medical history significant for hypertension, hyperlipidemia with poor adherence to treatment, heavy smoker, who was in his usual state of heath until 12/05/12 when developed sudden onset of dizziness, imbalance, nausea, and vomiting. He doesn't recall having headache, double vision, difficulty swallowing, focal weakness, language or vision impairment. States he was mowing the grass and when he got off his lawn mower he lost his balance and fell to the ground   Assessment/Plan: Arterial ischemic stroke, vertebrobasilar, cerebellar, acute - HgbA1c: 7.6 , fasting lipid panel: LDL 144, HDl <40 - MRI, MRA: Bilateral acute on chronic cerebellar infarcts, left greater than right. No significant mass effect. - Possible petechial hemorrhage in the left cerebellar vermis.  - PT consult, OT consult: outpatient supervision. Speech eval no difficulties - Carotid dopplers/ TE Echocardiogram: pending. - Prophylactic therapy-Antiplatelet med: Aspirin - dose 325 mg PO daily  - Cardiac Monitoring: no events    DM2 (diabetes mellitus, type 2): - good control in-house. Change diet, low dose lantus. - metformin as an outpatient.    Dyslipidemia: - statins.    HTN (hypertension): - high on last measurement. - continue to monitor will allow certain degree of permissive HTN.   Code Status: Full Code  Family Communication: Spoke with family at bedside  Disposition Plan: Admit to inpatient    Consultants:  neurology  Procedures:  MRI  Echo  Carotid doppler  Antibiotics:  None  HPI/Subjective: Symptoms better  Objective: Filed Vitals:   12/09/12 0215 12/09/12 0439 12/09/12 0643 12/09/12 0900  BP: 101/57 125/75 128/82 150/85  Pulse: 69 65 71 64  Temp: 98.3 F (36.8 C) 97.9 F (36.6 C) 98.2 F (36.8 C) 98.4 F (36.9 C)  TempSrc: Oral Oral Oral Oral  Resp: 16 18 16 22  Height:    5' 7" (1.702  m)  Weight:      SpO2: 97% 100% 99% 100%   No intake or output data in the 24 hours ending 12/09/12 1503 Filed Weights   12/09/12 0034  Weight: 81.6 kg (179 lb 14.3 oz)    Exam:  General: Alert, awake, oriented x3, in no acute distress.  HEENT: No bruits, no goiter.  Heart: Regular rate and rhythm, without murmurs, rubs, gallops.  Lungs: Good air movement, bilateral air movement.  Abdomen: Soft, nontender, nondistended, positive bowel sounds.  Neuro: Grossly intact, nonfocal.   Data Reviewed: Basic Metabolic Panel:  Recent Labs Lab 12/08/12 1802  NA 142  K 4.1  CL 100  CO2 29  GLUCOSE 256*  BUN 26*  CREATININE 0.99  CALCIUM 10.4   Liver Function Tests: No results found for this basename: AST, ALT, ALKPHOS, BILITOT, PROT, ALBUMIN,  in the last 168 hours No results found for this basename: LIPASE, AMYLASE,  in the last 168 hours No results found for this basename: AMMONIA,  in the last 168 hours CBC:  Recent Labs Lab 12/08/12 1802  WBC 11.0*  HGB 16.4  HCT 45.6  MCV 83.2  PLT 257   Cardiac Enzymes: No results found for this basename: CKTOTAL, CKMB, CKMBINDEX, TROPONINI,  in the last 168 hours BNP (last 3 results) No results found for this basename: PROBNP,  in the last 8760 hours CBG:  Recent Labs Lab 12/09/12 0646 12/09/12 1120  GLUCAP 173* 160*    No results found for this or any previous visit (from the past 240 hour(s)).   Studies: Dg Chest 2 View  12/08/2012  *  RADIOLOGY REPORT*  Clinical Data: 4-day history of nausea and vomiting and dizziness. Smoker with current history of hypertension.  CHEST - 2 VIEW  Comparison: None.  Findings: Suboptimal inspiration accounts for crowded bronchovascular markings, especially in the lung bases, and accentuates the cardiac silhouette.  Taking this into account, cardiac silhouette upper normal in size.  Hilar and mediastinal contours otherwise unremarkable.  Lungs clear.  No pleural effusions.  Visualized bony  thorax intact.  IMPRESSION: Suboptimal inspiration.  No acute cardiopulmonary disease.   Original Report Authenticated By: Thomas Lawrence, M.D.    Mr Mra Head Wo Contrast  12/08/2012  *RADIOLOGY REPORT*  Clinical Data:  52-year-old male with dizziness since Saturday. Vertigo.  Chest pain, neck pain.  Comparison: None.  MRI HEAD WITHOUT CONTRAST  Technique: Multiplanar, multiecho pulse sequences of the brain and surrounding structures were obtained according to standard protocol without intravenous contrast.  Findings: There is a 3 cm area of densely restricted diffusion in the inferior left cerebellar hemisphere. The left cerebellar vermis is affected. There is a smaller 15 mm area of densely restricted diffusion in the inferior right cerebellar hemisphere.  There are scattered additional foci of restricted diffusion in the cerebellar hemispheres.  There are underlying chronic infarcts in both posterior cerebellar hemispheres (series 3 image 7  and series 3 100 image 7).  No definite acute or chronic infarcts in the brain stem. Major intracranial vascular flow voids are preserved with intracranial artery dolichoectasia noted. However, there is abnormal increased FLAIR signal and decreased T2 signal in the the left PICA or a branch of the left PICA (series 6 image 5 and series 8 image 5) compatible with a slow flow or thrombus.  There is adjacent either punctate petechial hemorrhage or additional intravascular thrombus near the left cerebellar vermis (series 8 image 5).  MRA findings are below.  Associated T2 and FLAIR hyperintensity plus T1 hypointensity in the acutely affected areas.  No significant mass effect. No other acute intracranial hemorrhage.  No supratentorial diffusion abnormality.  No ventriculomegaly. Negative pituitary.  Negative cervicomedullary junction and visualized cervical spine.  Scattered patchy cerebral white matter T2 and FLAIR hyperintensity, mild to moderate for age.  No supratentorial  encephalomalacia.  Visualized bone marrow signal is within normal limits.  Visualized orbit soft tissues are within normal limits.  Minor paranasal sinus mucosal thickening.  Mastoids are clear.  Negative visualized internal auditory structures.  Negative scalp soft tissues.  IMPRESSION: 1.  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 No significant mass effect.  Possible petechial hemorrhage in the left cerebellar vermis.  Evidence of left PICA or a PICA branch slow flow or occlusion. 2.  No other acute intracranial abnormality.  Intracranial artery dolichoectasia.  See MRA findings below.  3.  Mild to moderate for age nonspecific cerebral white matter signal changes, favor chronic small vessel disease.  MRA HEAD WITHOUT CONTRAST  Technique: Angiographic images of the Circle of Willis were obtained using MRA technique without  intravenous contrast.  Findings: Antegrade flow in codominant distal vertebral arteries. Moderate to severe tortuosity of the V4 segments and vertebrobasilar junction.  No definite stenosis; mild asymmetric signal intensity is felt related to the vessel tortuosity.  No PICA flow signal identified on either side.  No definite AICA flow signal.  No basilar artery stenosis.  SCA and PCA origins are within normal limits.  Posterior communicating arteries are diminutive or absent.  Bilateral PCA branches are within normal limits.  Antegrade flow in   both ICA siphons.  Mildly tortuous distal cervical right ICA.  Mild to moderate cavernous ICA tortuosity. Both ophthalmic artery origins are within normal limits.  Normal carotid termini, MCA and ACA origins.  Anterior communicating artery is diminutive or absent.  Tortuous proximal ACA branches. Bilateral MCA M1 segments are within normal limits.  Mild irregularity of the bilateral M2 segments.  Otherwise negative visualized bilateral MCA branches.  IMPRESSION: 1.  No bilateral PICA or AICA flow signal detected.  Tortuous  distal vertebral arteries appear to be patent without definite lesion. 2.  Otherwise negative posterior circulation. 3.  Mild to moderate ICA siphon tortuosity.  Mild bilateral MCA M2 branch irregularity. 4.  Otherwise negative anterior circulation.  Salient findings on this study discussed on 12/08/2012 at 2215 hours with PA Lisa Sanders in the ED.   Original Report Authenticated By: H. Hall III, M.D.    Mr Brain Wo Contrast  12/08/2012  *RADIOLOGY REPORT*  Clinical Data:  52-year-old male with dizziness since Saturday. Vertigo.  Chest pain, neck pain.  Comparison: None.  MRI HEAD WITHOUT CONTRAST  Technique: Multiplanar, multiecho pulse sequences of the brain and surrounding structures were obtained according to standard protocol without intravenous contrast.  Findings: There is a 3 cm area of densely restricted diffusion in the inferior left cerebellar hemisphere. The left cerebellar vermis is affected. There is a smaller 15 mm area of densely restricted diffusion in the inferior right cerebellar hemisphere.  There are scattered additional foci of restricted diffusion in the cerebellar hemispheres.  There are underlying chronic infarcts in both posterior cerebellar hemispheres (series 3 image 7  and series 3 100 image 7).  No definite acute or chronic infarcts in the brain stem. Major intracranial vascular flow voids are preserved with intracranial artery dolichoectasia noted. However, there is abnormal increased FLAIR signal and decreased T2 signal in the the left PICA or a branch of the left PICA (series 6 image 5 and series 8 image 5) compatible with a slow flow or thrombus.  There is adjacent either punctate petechial hemorrhage or additional intravascular thrombus near the left cerebellar vermis (series 8 image 5).  MRA findings are below.  Associated T2 and FLAIR hyperintensity plus T1 hypointensity in the acutely affected areas.  No significant mass effect. No other acute intracranial hemorrhage.  No  supratentorial diffusion abnormality.  No ventriculomegaly. Negative pituitary.  Negative cervicomedullary junction and visualized cervical spine.  Scattered patchy cerebral white matter T2 and FLAIR hyperintensity, mild to moderate for age.  No supratentorial encephalomalacia.  Visualized bone marrow signal is within normal limits.  Visualized orbit soft tissues are within normal limits.  Minor paranasal sinus mucosal thickening.  Mastoids are clear.  Negative visualized internal auditory structures.  Negative scalp soft tissues.  IMPRESSION: 1.  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 No significant mass effect.  Possible petechial hemorrhage in the left cerebellar vermis.  Evidence of left PICA or a PICA branch slow flow or occlusion. 2.  No other acute intracranial abnormality.  Intracranial artery dolichoectasia.  See MRA findings below.  3.  Mild to moderate for age nonspecific cerebral white matter signal changes, favor chronic small vessel disease.  MRA HEAD WITHOUT CONTRAST  Technique: Angiographic images of the Circle of Willis were obtained using MRA technique without  intravenous contrast.  Findings: Antegrade flow in codominant distal vertebral arteries. Moderate to severe tortuosity of the V4 segments and vertebrobasilar junction.  No definite stenosis; mild asymmetric signal intensity is felt   related to the vessel tortuosity.  No PICA flow signal identified on either side.  No definite AICA flow signal.  No basilar artery stenosis.  SCA and PCA origins are within normal limits.  Posterior communicating arteries are diminutive or absent.  Bilateral PCA branches are within normal limits.  Antegrade flow in both ICA siphons.  Mildly tortuous distal cervical right ICA.  Mild to moderate cavernous ICA tortuosity. Both ophthalmic artery origins are within normal limits.  Normal carotid termini, MCA and ACA origins.  Anterior communicating artery is diminutive or absent.   Tortuous proximal ACA branches. Bilateral MCA M1 segments are within normal limits.  Mild irregularity of the bilateral M2 segments.  Otherwise negative visualized bilateral MCA branches.  IMPRESSION: 1.  No bilateral PICA or AICA flow signal detected.  Tortuous distal vertebral arteries appear to be patent without definite lesion. 2.  Otherwise negative posterior circulation. 3.  Mild to moderate ICA siphon tortuosity.  Mild bilateral MCA M2 branch irregularity. 4.  Otherwise negative anterior circulation.  Salient findings on this study discussed on 12/08/2012 at 2215 hours with PA Lisa Sanders in the ED.   Original Report Authenticated By: H. Hall III, M.D.     Scheduled Meds: . aspirin  325 mg Oral Daily  . heparin  5,000 Units Subcutaneous Q8H  . losartan  50 mg Oral QPM   And  . hydrochlorothiazide  12.5 mg Oral QPM  . insulin aspart  0-15 Units Subcutaneous TID WC  . simvastatin  20 mg Oral q1800   Continuous Infusions:    Gregory Gordon, Gregory Gordon  [facility] Pager [phone].  If 8PM-8AM, please contact night-coverage at [email or web address], password [email or web address] 12/09/2012, 3:03 PM  LOS: 1 day        

## 2012-12-10 NOTE — Interval H&P Note (Signed)
History and Physical Interval Note:  12/10/2012 1:03 PM  Gregory Gordon  has presented today for surgery, with the diagnosis of Stroke  The various methods of treatment have been discussed with the patient and family. After consideration of risks, benefits and other options for treatment, the patient has consented to  Procedure(s) with comments: TRANSESOPHAGEAL ECHOCARDIOGRAM (TEE) (N/A) - Rm 4N24 as a surgical intervention .  The patient's history has been reviewed, patient examined, no change in status, stable for surgery.  I have reviewed the patient's chart and labs.  Questions were answered to the patient's satisfaction.     [name]

## 2012-12-10 NOTE — Progress Notes (Signed)
Physical Therapy Treatment Patient Details Name: Gregory Gordon MRN: [id number] DOB: Jan 24, 1961 Today's Date: 12/10/2012 Time: 4098-1191 PT Time Calculation (min): 23 min  PT Assessment / Plan / Recommendation Comments on Treatment Session  Pt very unsteady today.  Requires mod (A) to maintain balance while ambulating.  Pt with poor safety awareness of balance deficits and need for (A).  Pt will definitely will need 24-7 (A) plus d/c recommendation updated to HHPT Family indicates available at home.      Follow Up Recommendations  Home health PT;Supervision/Assistance - 24 hour     Does the patient have the potential to tolerate intense rehabilitation     Barriers to Discharge        Equipment Recommendations  None recommended by PT    Recommendations for Other Services    Frequency Min 4X/week   Plan Discharge plan needs to be updated;Frequency remains appropriate    Precautions / Restrictions Precautions Precautions: Fall Restrictions Weight Bearing Restrictions: No   Pertinent Vitals/Pain Pt denied pain today.    Mobility  Bed Mobility Bed Mobility: Not assessed Transfers Transfers: Sit to Stand;Stand to Sit Sit to Stand: 5: Supervision Stand to Sit: 7: Independent Details for Transfer Assistance: Pt very impulsive and off balance.  Doesn't seem to recognize when off balance. Ambulation/Gait Ambulation/Gait Assistance: 3: Mod assist Ambulation Distance (Feet): 600 Feet Assistive device: None Ambulation/Gait Assistance Details: Pt very unsteady, multiple LOB requiring Mod (A) to prevent falling, especially to right side and forward with stop/go. Pt was scissoring with difficulty in straight path.  Pt continues to demonstrate increased steadiness with increased gait speed however still has increased instability with head turns and with slower gait speed and directional changes today.  Gait Pattern: Step-through pattern;Decreased stride length;Shuffle;Scissoring General  Gait Details: Pt much more unsteady today compared to yesterday's PT session as evidenced by PT note of 3/19.  Pt required mod (A) to maintain balance while ambulating and performing DGI. Stairs: Yes Stairs Assistance: 4: Min guard;4: Min assist Stairs Assistance Details (indicate cue type and reason): Max cuing to slow down to maintain safety.  Pt moves very quickly, especially descending stairs. Stair Management Technique: Two rails;Forwards;Alternating pattern Number of Stairs: 15 Wheelchair Mobility Wheelchair Mobility: No    Exercises     PT Diagnosis:    PT Problem List:   PT Treatment Interventions:     PT Goals Acute Rehab PT Goals Time For Goal Achievement: 12/16/12 Potential to Achieve Goals: Good Pt will Ambulate: >150 feet;with modified independence;Other (comment) PT Goal: Ambulate - Progress: Not progressing Pt will Go Up / Down Stairs: 6-9 stairs;with rail(s);with modified independence PT Goal: Up/Down Stairs - Progress: Progressing toward goal Additional Goals Additional Goal #1: Pt to score >19 on DGI PT Goal: Additional Goal #1 - Progress: Not progressing  Visit Information  Last PT Received On: 12/10/12 Assistance Needed: +1    Subjective Data  Subjective: Pt reports no pain and he wants to go home.  Family in room said his balance was not good today.  Pt said he felt his balance was fine.   Cognition  Cognition Overall Cognitive Status: Impaired Area of Impairment: Safety/judgement;Awareness of deficits;Awareness of errors Arousal/Alertness: Awake/alert Orientation Level: Appears intact for tasks assessed Behavior During Session: [facility] for tasks performed Safety/Judgement: Decreased awareness of safety precautions;Decreased safety judgement for tasks assessed;Impulsive;Decreased awareness of need for assistance Safety/Judgement - Other Comments: Pt ambulating around room per nursing Awareness of Errors: Assistance required to identify errors  made;Assistance required  to correct errors made Awareness of Errors - Other Comments: when asked if realized he had LOB to rt and required (A) to correct, indicated he only felt being pulled back to left. Awareness of Deficits: Pt required increased (A) to correct LOB this session compared to last.  Pt states he feels fine dispite need for multiple instances of (A) to correct LOB. Cognition - Other Comments: Pt very impulsive, requires max cuing to slow down and to focus on task at hand.  Pt states he always moves at "100 miles an hour and it is hard to change".  Pt with multiple LOB and still does not modify behavior or seem aware of amount of (A) required to maintain balance.    Balance  Dynamic Gait Index Level Surface: Moderate Impairment Change in Gait Speed: Severe Impairment Gait with Horizontal Head Turns: Severe Impairment Gait with Vertical Head Turns: Severe Impairment Gait and Pivot Turn: Moderate Impairment Step Over Obstacle: Severe Impairment Steps: Mild Impairment  End of Session PT - End of Session Equipment Utilized During Treatment: Gait belt Activity Tolerance: Patient tolerated treatment well Patient left: in chair;with call bell/phone within reach;with family/visitor present Nurse Communication: Mobility status   GP     Enid Baas, SPTA 12/10/2012, 10:50 AM

## 2012-12-10 NOTE — Progress Notes (Signed)
Agree with PTA.    Tylea Hise, PT [phone]  

## 2012-12-11 ENCOUNTER — Encounter (HOSPITAL_COMMUNITY): Payer: Self-pay | Admitting: Cardiology

## 2012-12-11 DIAGNOSIS — E119 Type 2 diabetes mellitus without complications: Secondary | ICD-10-CM

## 2012-12-11 DIAGNOSIS — I6350 Cerebral infarction due to unspecified occlusion or stenosis of unspecified cerebral artery: Secondary | ICD-10-CM

## 2012-12-11 DIAGNOSIS — E785 Hyperlipidemia, unspecified: Secondary | ICD-10-CM

## 2012-12-11 LAB — GLUCOSE, CAPILLARY
Glucose-Capillary: 185 mg/dL — ABNORMAL HIGH (ref 70–99)
Glucose-Capillary: 257 mg/dL — ABNORMAL HIGH (ref 70–99)

## 2012-12-11 MED ORDER — NIACIN 500 MG PO TABS: 500.0000 mg | ORAL_TABLET | Freq: Every day | ORAL | Status: DC

## 2012-12-11 MED ORDER — LIVING WELL WITH DIABETES BOOK
Freq: Once | Status: AC
  Administered 2012-12-11: 11:00:00
  Filled 2012-12-11: qty 1

## 2012-12-11 MED ORDER — METFORMIN HCL 500 MG PO TABS: 500.0000 mg | ORAL_TABLET | Freq: Two times a day (BID) | ORAL | Status: DC

## 2012-12-11 MED ORDER — ASPIRIN 81 MG PO TABS: 162.0000 mg | ORAL_TABLET | Freq: Every day | ORAL | Status: DC

## 2012-12-11 MED ORDER — POLYETHYLENE GLYCOL 3350 17 G PO PACK
17.0000 g | PACK | Freq: Every day | ORAL | Status: DC
  Filled 2012-12-11: qty 1

## 2012-12-11 NOTE — Progress Notes (Signed)
Inpatient Diabetes Program Recommendations  AACE/ADA: New Consensus Statement on Inpatient Glycemic Control (2013)  Target Ranges:  Prepandial:   less than 140 mg/dL      Peak postprandial:   less than 180 mg/dL (1-2 hours)      Critically ill patients:  140 - 180 mg/dL    Results for CARLIS, BLANCHARD (MRN [id number]) as of 12/11/2012 12:51  Ref. Range 12/09/2012 04:35  Hemoglobin A1C Latest Range: <5.7 % 7.6 (H)   Patient with new onset diabetes.  A1c 7.6%.    Spoke with pt about new diagnosis.  Discussed A1C results with him and explained what an A1C is, basic pathophysiology of DM Type 2, basic home care, importance of checking CBGs and maintaining good CBG control to prevent long-term and short-term complications.  RNs to provide ongoing basic DM education at bedside with this patient.  Have ordered educational booklet, DM videos, and RD consult for diet education.  Patient very receptive to information.  States he was being followed by a physician in [place] and was told he had "a touch of sugar" several months ago.  Encouraged patient to purchase a generic CBG meter OTC at [facility].  Discussed the medication Metformin with patient.  Noted patient likely to be d/c'd home on Metformin per MD notes.  Reviewed how to take Metformin and possible side effects.  Also encouraged patient to check his CBG at least 1-2 times daily and to keep a record of his CBGs for his PCP.   Will follow. Ambrose Finland RN, MSN, CDE Diabetes Coordinator Inpatient Diabetes Program [phone]

## 2012-12-11 NOTE — Progress Notes (Signed)
Inpatient Diabetes Program Recommendations  AACE/ADA: New Consensus Statement on Inpatient Glycemic Control (2013)  Target Ranges:  Prepandial:   less than 140 mg/dL      Peak postprandial:   less than 180 mg/dL (1-2 hours)      Critically ill patients:  140 - 180 mg/dL    Note: In reviewing the patient's chart, noted that according to the H&P patient did not have a prior diagnosis of diabetes but did have elevated blood glucose.  Initial lab glucose was 256 mg/dl on 06/06/7828 and F6O was 7.6% on 12/09/2012.  Patient will need diabetes education and nutrition counseling.  Ordered Living well with diabetes booklet from pharmacy.  Bedside RNs to educate patient on diabetes and review booklet with patient.  Please have patient watch DM education videos.   Will continue to follow as an inpatient and will plan to follow up with patient to be sure he understands self diabetes management.  Thanks, Orlando Penner, RN, BSN, CCRN Diabetes Coordinator Inpatient Diabetes Program [phone]

## 2012-12-11 NOTE — Plan of Care (Addendum)
Problem: Food- and Nutrition-Related Knowledge Deficit (NB-1.1) Goal: Nutrition education Formal process to instruct or train a patient/client in a skill or to impart knowledge to help patients/clients voluntarily manage or modify food choices and eating behavior to maintain or improve health.  Outcome: Completed/Met Date Met:  12/11/12  RD consulted for nutrition education regarding diabetes.     Lab Results  Component Value Date    HGBA1C 7.6* 12/09/2012    RD spoke with patient's wife.  Provided "Carbohydrate Counting for People with Diabetes" handout from the Academy of Nutrition and Dietetics. Discussed different food groups and their effects on blood sugar, emphasizing carbohydrate-containing foods. Provided list of carbohydrates and recommended serving sizes of common foods.  Discussed importance of controlled and consistent carbohydrate intake throughout the day. Provided examples of ways to balance meals/snacks and encouraged intake of high-fiber, whole grain complex carbohydrates. Teach back method used.  Expect fair compliance.  Body mass index is 28.17 kg/(m^2). Pt meets criteria for Overweight based on current BMI.  Current diet order is Carbohydrate Modified Medium Calorie, patient is consuming approximately 100% of meals at this time.  Labs and medications reviewed.   No further nutrition interventions warranted at this time.  If additional nutrition issues arise, please re-consult RD.  Maureen Chatters, RD, LDN Pager #: [phone] After-Hours Pager #: [phone]

## 2012-12-11 NOTE — Progress Notes (Signed)
Stroke Team Progress Note  HISTORY Gregory Gordon is an 52 y.o. male, right handed, with a past medical history significant for hypertension, hyperlipidemia with poor adherence to treatment, heavy smoker, who was in his usual state of heath until 12/05/12 when developed sudden onset of dizziness, imbalance, nausea, and vomiting. He doesn't recall having headache, double vision, difficulty swallowing, focal weakness, language or vision impairment. States he was mowing the grass and when he got off his lawn mower he lost his balance and fell to the ground.  Gregory Gordon expressed that he never had similar symptoms before, but ever since has been bothered by a spinning sensation and lack of balance every time he tries to stand up and walk. Did not seek immediate medical attention and waited until yesterday 12/07/2012 when he decided to see his primary care physician who prescribed Antivert. He did not get any better and thus came to the ED 12/08/2012 for further evaluation.   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 No significant mass effect. Possible petechial hemorrhage in the left cerebellar vermis.  MRA brain no bilateral PICA or AICA flow signal detected. BA and PCA are okay, as well the anterior circulation.  Patient was not a TPA candidate secondary to delay in arrival. He was admitted for further evaluation and treatment.  SUBJECTIVE  Patient sitting in bedside chair. Wife in room. Patient denies any further problems. Just back for LE dopplers.  Further discussion with patient reveals no history of recent head/neck trauma.  OBJECTIVE Most recent Vital Signs: Filed Vitals:   12/10/12 2134 12/11/12 0229 12/11/12 0526 12/11/12 1024  BP: 140/89 104/67 117/72 135/83  Pulse: 56 55 66 66  Temp: 98.2 F (36.8 C) 97.5 F (36.4 C) 98.2 F (36.8 C) 98.3 F (36.8 C)  TempSrc: Oral Oral Oral   Resp: 18 18 18 20   Height:      Weight:      SpO2: 97%  98% 100% 100%   CBG (last 3)   Recent Labs  12/10/12 1703 12/10/12 2124 12/11/12 0657  GLUCAP 125* 212* 185*   IV Fluid Intake:     MEDICATIONS  . aspirin  325 mg Oral Daily  . heparin  5,000 Units Subcutaneous Q8H  . losartan  50 mg Oral QPM   And  . hydrochlorothiazide  12.5 mg Oral QPM  . insulin aspart  0-15 Units Subcutaneous TID WC  . insulin glargine  5 Units Subcutaneous QHS  . niacin  500 mg Oral QHS  . polyethylene glycol  17 g Oral Daily  . simvastatin  20 mg Oral q1800   PRN:    Diet:  Carb Control  Activity:   Up with assistance as tolerated DVT Prophylaxis:  Heparin 5000 units sq tid  CLINICALLY SIGNIFICANT STUDIES Basic Metabolic Panel:   Recent Labs Lab 12/08/12 1802  NA 142  K 4.1  CL 100  CO2 29  GLUCOSE 256*  BUN 26*  CREATININE 0.99  CALCIUM 10.4   CBC:   Recent Labs Lab 12/08/12 1802  WBC 11.0*  HGB 16.4  HCT 45.6  MCV 83.2  PLT 257   Lipid Panel    Component Value Date/Time   CHOL 251* 12/08/2012 2340   TRIG 386* 12/08/2012 2340   HDL 30* 12/08/2012 2340   CHOLHDL 8.4 12/08/2012 2340   VLDL 77* 12/08/2012 2340   LDLCALC 144* 12/08/2012 2340   HgbA1C  Lab Results  Component Value Date  HGBA1C 7.6* 12/09/2012   Urine Drug Screen:   No results found for this basename: labopia,  cocainscrnur,  labbenz,  amphetmu,  thcu,  labbarb    Alcohol Level: No results found for this basename: ETH,  in the last 168 hours  Hypercoagulable Workup Normal - C3, C4, ANA, RPR, ESR, HIV, Protein C & S total, homocysteine, cardiolipin antibody, Protein S activity, lupus, CH50 Anti III - 127 (h) Protein C activity-200 (h)  MRI of the brain  12/08/2012   1.  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 No significant mass effect.  Possible petechial hemorrhage in the left cerebellar vermis.  Evidence of left PICA or a PICA branch slow flow or occlusion. 2.  No other acute intracranial abnormality.  Intracranial artery  dolichoectasia.  See MRA findings below.  3.  Mild to moderate for age nonspecific cerebral white matter signal changes, favor chronic small vessel disease.    MRA of the brain  12/08/2012  1.  No bilateral PICA or AICA flow signal detected.  Tortuous distal vertebral arteries appear to be patent without definite lesion. 2.  Otherwise negative posterior circulation. 3.  Mild to moderate ICA siphon tortuosity.  Mild bilateral MCA M2 branch irregularity. 4.  Otherwise negative anterior circulation.  2D Echocardiogram  EF 55-60% with no source of embolus.   TEE 12/10/2012: EF 60%, no wall motion abnormalities, Echo contrast study showed a right-to-left atrial level shunt, in the baseline state consistent with PFO. There was an atrial septal aneurysm.  Carotid Doppler  No evidence of hemodynamically significant internal carotid artery stenosis. Vertebral artery flow is antegrade.   CXR  No acute cardiopulmonary disease.  EKG  normal sinus rhythm.   Therapy Recommendations home health PT, no OT needs  Physical Exam   [name] currently not in distress. Awake alert. Afebrile. Head is nontraumatic. Neck is supple without bruit. Hearing is normal. Cardiac exam no murmur or gallop. Lungs are clear to auscultation. Distal pulses are well felt.  Neurological Exam : Awake  Alert oriented x 3. Normal speech and language. Eye movements full with saccadic dysmetria on lateral gazes. Vision acuity and fields appear normal. Hearing is normal. Face symmetric. Tongue midline. Normal strength, tone, reflexes and coordination. Normal sensation. SLOW CAUTIOUS GAIT.  ASSESSMENT Gregory Gordon is a 52 y.o. male presenting with dizziness, imbalance, nausea, and vomiting.  Imaging confirms a left greater than right cerebellar  infarcts. Infarcts felt to be embolic, TEE does show PFO.  On no antiplatelets prior to admission. Now on aspirin 325 mg orally every day for secondary stroke prevention.  Patient with resultant ataxic gait, dizziness, nausea intermittently.    Hypertension Hyperlipidemia, LDL 144, on statin PTA, on statin now, goal LDL < 100 Cigarette smoker, smoking cessation counselling Family hx MI < 35 - brother w/ secondary stroke at age 77, dad with heart problems as a young man Patent foramen ovale  Hospital day # 3  TREATMENT/PLAN  Continue aspirin 325 mg orally every day for secondary stroke prevention. Stop smoking, cessation counsellling PFO (patent foramen ovale), lower extremity dopplers are negative for DVT; no role for PFO closure at this time, unless patient considers enrollment into a research trial. Risk factor management Medical compliance reinforced F/u with PCP in 1-2 months F/u in stroke clinic in 2-3 months  Larita Fife D. Manson Passey, [facility], MBA, MHA [facility] Pager: [phone] 12/11/2012 1:49 PM   I evaluated patient and reviewed labs,  imaging and notes. I agree with plan above. Medical management. Consider PFO closure trial when he follows up in stroke clinic.  Suanne Marker, MD 12/11/2012, 9:16 PM Certified in Neurology, Neurophysiology and Neuroimaging [facility] - Stroke Team  Please refer to [email or web address] for on-call Stroke MD

## 2012-12-11 NOTE — Progress Notes (Signed)
*  PRELIMINARY RESULTS* Vascular Ultrasound Bilateral lower extremity venous duplex has been completed.  Preliminary findings: Bilateral:  No evidence of DVT, superficial thrombosis, or Baker's Cyst.   Gregory Gordon 12/11/2012, 2:25 PM

## 2012-12-11 NOTE — Progress Notes (Signed)
Physical Therapy Treatment Patient Details Name: Gregory Gordon MRN: [id number] DOB: Jun 07, 1961 Today's Date: 12/11/2012 Time: 1478-2956 PT Time Calculation (min): 19 min  PT Assessment / Plan / Recommendation Comments on Treatment Session  Pt cont's to present with balance deficits & decreased safety awareness (although he verbalizes he's aware of himself being a fall risk).  Pt required Min A to prevent LOB during ambulation at this date.  Pt encouraged to slow down, minimize distractions and limit community setting until balance issues improved  Pt's wife educated on safe guarding/handling during ambulation, demonstrated Min A during ambulation.     Follow Up Recommendations  Home health PT;Supervision/Assistance - 24 hour     Does the patient have the potential to tolerate intense rehabilitation     Barriers to Discharge        Equipment Recommendations  None recommended by PT    Recommendations for Other Services    Frequency Min 4X/week   Plan Discharge plan remains appropriate;Frequency remains appropriate    Precautions / Restrictions Precautions Precautions: Fall Restrictions Weight Bearing Restrictions: No   Pertinent Vitals/Pain Pt denies pain    Mobility  Bed Mobility Bed Mobility: Not assessed Transfers Transfers: Sit to Stand;Stand to Sit Sit to Stand: 5: Supervision Stand to Sit: 7: Independent Ambulation/Gait Ambulation/Gait Assistance: 4: Min assist Ambulation Distance (Feet): 600 Feet Assistive device: None Ambulation/Gait Assistance Details: Pt has increased difficulty maintaining balance when distracted.   Gait Pattern: Step-through pattern;Decreased stride length;Shuffle;Scissoring General Gait Details: Pt more steady today compared to yesterday's PT session as evidenced by PT note of 3/20.  Pt demonstrated decreased velocity, still has LOB to right and needs min A to maintain balance, especially when distracted by tasks such as conversation,  directional changes,  and looking side to side. Stairs: Yes Stairs Assistance: 4: Min guard Stair Management Technique: No rails;Alternating pattern;Forwards Number of Stairs: 15 Wheelchair Mobility Wheelchair Mobility: No      PT Goals Acute Rehab PT Goals Time For Goal Achievement: 12/16/12 Potential to Achieve Goals: Good Pt will Ambulate: >150 feet;with modified independence;Other (comment) PT Goal: Ambulate - Progress: Progressing toward goal Pt will Go Up / Down Stairs: 6-9 stairs;with rail(s);with modified independence PT Goal: Up/Down Stairs - Progress: Progressing toward goal  Visit Information  Last PT Received On: 12/11/12 Assistance Needed: +1    Subjective Data  Subjective: Pt states he feels cooped up here and he will be fine once he goes home.   Cognition  Cognition Overall Cognitive Status: Impaired Area of Impairment: Safety/judgement;Awareness of errors;Awareness of deficits Arousal/Alertness: Awake/alert Orientation Level: Appears intact for tasks assessed Behavior During Session: [facility] for tasks performed Safety/Judgement: Decreased awareness of safety precautions;Decreased safety judgement for tasks assessed;Impulsive;Decreased awareness of need for assistance Safety/Judgement - Other Comments: Pt self reports ambulating around room at 1 am Awareness of Errors: Assistance required to identify errors made;Assistance required to correct errors made Awareness of Errors - Other Comments: Pt verbalizes that he needs to slow down to be safe but continues to have LOB to rt and states he will be fine when he gets home. Cognition - Other Comments: Pt did slow down this session, still very impulsive with LOB to rt.    Balance     End of Session PT - End of Session Equipment Utilized During Treatment: Gait belt Activity Tolerance: Patient tolerated treatment well Patient left: in chair;with call bell/phone within reach;with family/visitor present Nurse  Communication: Mobility status   GP     [place],  Eual Fines 12/11/2012, 12:41 PM    Verdell Face, PTA [phone] 12/11/2012

## 2012-12-11 NOTE — Discharge Summary (Signed)
Physician Discharge Summary  Gregory Gordon YQM:[id number] DOB: 1961-09-02 DOA: 12/08/2012  PCP: Default, Provider, MD  Admit date: 12/08/2012 Discharge date: 12/11/2012  Time spent: 30 minutes  Recommendations for Outpatient Follow-up:  1. Follow up with Neuro  Discharge Diagnoses:  Principal Problem:   Arterial ischemic stroke, vertebrobasilar, cerebellar, acute Active Problems:   DM2 (diabetes mellitus, type 2)   Dyslipidemia   HTN (hypertension)   Family history of early CAD   Q waves suggestive of previous myocardial infarction   Discharge Condition: stable  Diet recommendation: low carb mod diet  Filed Weights   12/09/12 0034  Weight: 81.6 kg (179 lb 14.3 oz)    History of present illness:  Please refer to H&P for further details, 52 y.o. male who presents to the ED with dizziness. Symptoms onset 4 days ago after mowing the grass outside. Quality is a "room spinning sensation" relieved by lying flat and motionless. Was associated nausea and vomiting which has since resolved. In ED work up demonstrated acute on chronic B cerebellar ischemic strokes, BGL was elevated at 256 (patient has no formal diagnosis of DM though it sounds like such may have been suspected previously), neurology has been consulted and hospitalist has been asked to admit.   Hospital Course:  Arterial ischemic stroke, vertebrobasilar, cerebellar, acute  - HgbA1c: 7.6 , fasting lipid panel: LDL 144, HDl <40  - MRI, MRA: as below.  - PT consult, OT consult: outpatient supervision. Speech eval no difficulties  - Carotid dopplers no ICA - TTEchocardiogram: 55% to 60%. Wall motion was normal; there were no regional wall motion abnormalities.  - TEE showed: positive saline microcavitation study. - Lower extremity DVT: showed no DVT - Prophylactic therapy-Antiplatelet med: Aspirin - dose 162 mg PO daily  - Cardiac Monitoring: no events   DM2 (diabetes mellitus, type 2):  - good control in-house. Change  diet, low dose lantus.  - metformin as an outpatient.   Dyslipidemia:  - statins.  - Niacin HDL <40.  HTN (hypertension):  - high on last measurement.  - continue to monitor will allow certain degree of permissive HTN.  - continue to titrate as an outpatient.  Procedures: MRI : Bilateral acute on chronic cerebellar infarcts, left greater than right. No significant mass effect. - Possible petechial hemorrhage in the left cerebellar vermis. TEE :positive saline microcavitation study. Doppler of lower ext no DVT. Carotid doppler: no ICA b/l  Consultations:  neuro  Discharge Exam: Filed Vitals:   12/10/12 2134 12/11/12 0229 12/11/12 0526 12/11/12 1024  BP: 140/89 104/67 117/72 135/83  Pulse: 56 55 66 66  Temp: 98.2 F (36.8 C) 97.5 F (36.4 C) 98.2 F (36.8 C) 98.3 F (36.8 C)  TempSrc: Oral Oral Oral   Resp: 18 18 18 20   Height:      Weight:      SpO2: 97% 98% 100% 100%    General: A&O x3  Cardiovascular: RRR Respiratory: good air movement CTA B/L  Discharge Instructions  Discharge Orders   Future Orders Complete By Expires     Diet - low sodium heart healthy  As directed     Increase activity slowly  As directed         Medication List    TAKE these medications       acetaminophen 650 MG CR tablet  Commonly known as:  TYLENOL  Take 1,950 mg by mouth daily as needed for pain.     aspirin 81 MG tablet  Take  2 tablets (162 mg total) by mouth daily.     losartan-hydrochlorothiazide 50-12.5 MG per tablet  Commonly known as:  HYZAAR  Take 1 tablet by mouth every evening.     meclizine 25 MG tablet  Commonly known as:  ANTIVERT  Take 25 mg by mouth 3 (three) times daily.     metFORMIN 500 MG tablet  Commonly known as:  GLUCOPHAGE  Take 1 tablet (500 mg total) by mouth 2 (two) times daily with a meal.     niacin 500 MG tablet  Take 1 tablet (500 mg total) by mouth at bedtime.     pravastatin 20 MG tablet  Commonly known as:  PRAVACHOL  Take 20 mg  by mouth every other day.           Follow-up Information   Follow up with SETHI,PRAMODKUMAR P, MD In 2 weeks. (hospital follow up)    Contact information:   [address] [place] [address] [phone]        The results of significant diagnostics from this hospitalization (including imaging, microbiology, ancillary and laboratory) are listed below for reference.    Significant Diagnostic Studies: Dg Chest 2 View  12/08/2012  *RADIOLOGY REPORT*  Clinical Data: 4-day history of nausea and vomiting and dizziness. Smoker with current history of hypertension.  CHEST - 2 VIEW  Comparison: None.  Findings: Suboptimal inspiration accounts for crowded bronchovascular markings, especially in the lung bases, and accentuates the cardiac silhouette.  Taking this into account, cardiac silhouette upper normal in size.  Hilar and mediastinal contours otherwise unremarkable.  Lungs clear.  No pleural effusions.  Visualized bony thorax intact.  IMPRESSION: Suboptimal inspiration.  No acute cardiopulmonary disease.   Original Report Authenticated By: Hulan Saas, M.D.    Mr [facility] Wo Contrast  12/08/2012  *RADIOLOGY REPORT*  Clinical Data:  52 year old male with dizziness since Saturday. Vertigo.  Chest pain, neck pain.  Comparison: None.  MRI HEAD WITHOUT CONTRAST  Technique: Multiplanar, multiecho pulse sequences of the brain and surrounding structures were obtained according to standard protocol without intravenous contrast.  Findings: There is a 3 cm area of densely restricted diffusion in the inferior left cerebellar hemisphere. The left cerebellar vermis is affected. There is a smaller 15 mm area of densely restricted diffusion in the inferior right cerebellar hemisphere.  There are scattered additional foci of restricted diffusion in the cerebellar hemispheres.  There are underlying chronic infarcts in both posterior cerebellar hemispheres (series 3 image 7  and series 3 100 image  7).  No definite acute or chronic infarcts in the brain stem. Major intracranial vascular flow voids are preserved with intracranial artery dolichoectasia noted. However, there is abnormal increased FLAIR signal and decreased T2 signal in the the left PICA or a branch of the left PICA (series 6 image 5 and series 8 image 5) compatible with a slow flow or thrombus.  There is adjacent either punctate petechial hemorrhage or additional intravascular thrombus near the left cerebellar vermis (series 8 image 5).  MRA findings are below.  Associated T2 and FLAIR hyperintensity plus T1 hypointensity in the acutely affected areas.  No significant mass effect. No other acute intracranial hemorrhage.  No supratentorial diffusion abnormality.  No ventriculomegaly. Negative pituitary.  Negative cervicomedullary junction and visualized cervical spine.  Scattered patchy cerebral white matter T2 and FLAIR hyperintensity, mild to moderate for age.  No supratentorial encephalomalacia.  Visualized bone marrow signal is within normal limits.  Visualized orbit soft  tissues are within normal limits.  Minor paranasal sinus mucosal thickening.  Mastoids are clear.  Negative visualized internal auditory structures.  Negative scalp soft tissues.  IMPRESSION: 1.  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 No significant mass effect.  Possible petechial hemorrhage in the left cerebellar vermis.  Evidence of left PICA or a PICA branch slow flow or occlusion. 2.  No other acute intracranial abnormality.  Intracranial artery dolichoectasia.  See MRA findings below.  3.  Mild to moderate for age nonspecific cerebral white matter signal changes, favor chronic small vessel disease.  MRA HEAD WITHOUT CONTRAST  Technique: Angiographic images of the Circle of Willis were obtained using MRA technique without  intravenous contrast.  Findings: Antegrade flow in codominant distal vertebral arteries. Moderate to severe  tortuosity of the V4 segments and vertebrobasilar junction.  No definite stenosis; mild asymmetric signal intensity is felt related to the vessel tortuosity.  No PICA flow signal identified on either side.  No definite AICA flow signal.  No basilar artery stenosis.  SCA and PCA origins are within normal limits.  Posterior communicating arteries are diminutive or absent.  Bilateral PCA branches are within normal limits.  Antegrade flow in both ICA siphons.  Mildly tortuous distal cervical right ICA.  Mild to moderate cavernous ICA tortuosity. Both ophthalmic artery origins are within normal limits.  Normal carotid termini, MCA and ACA origins.  Anterior communicating artery is diminutive or absent.  Tortuous proximal ACA branches. Bilateral MCA M1 segments are within normal limits.  Mild irregularity of the bilateral M2 segments.  Otherwise negative visualized bilateral MCA branches.  IMPRESSION: 1.  No bilateral PICA or AICA flow signal detected.  Tortuous distal vertebral arteries appear to be patent without definite lesion. 2.  Otherwise negative posterior circulation. 3.  Mild to moderate ICA siphon tortuosity.  Mild bilateral MCA M2 branch irregularity. 4.  Otherwise negative anterior circulation.  Salient findings on this study discussed on 12/08/2012 at 2215 hours with PA Sharilyn Sites in the ED.   Original Report Authenticated By: Erskine Speed, M.D.    Mr Brain Wo Contrast  12/08/2012  *RADIOLOGY REPORT*  Clinical Data:  52 year old male with dizziness since Saturday. Vertigo.  Chest pain, neck pain.  Comparison: None.  MRI HEAD WITHOUT CONTRAST  Technique: Multiplanar, multiecho pulse sequences of the brain and surrounding structures were obtained according to standard protocol without intravenous contrast.  Findings: There is a 3 cm area of densely restricted diffusion in the inferior left cerebellar hemisphere. The left cerebellar vermis is affected. There is a smaller 15 mm area of densely restricted  diffusion in the inferior right cerebellar hemisphere.  There are scattered additional foci of restricted diffusion in the cerebellar hemispheres.  There are underlying chronic infarcts in both posterior cerebellar hemispheres (series 3 image 7  and series 3 100 image 7).  No definite acute or chronic infarcts in the brain stem. Major intracranial vascular flow voids are preserved with intracranial artery dolichoectasia noted. However, there is abnormal increased FLAIR signal and decreased T2 signal in the the left PICA or a branch of the left PICA (series 6 image 5 and series 8 image 5) compatible with a slow flow or thrombus.  There is adjacent either punctate petechial hemorrhage or additional intravascular thrombus near the left cerebellar vermis (series 8 image 5).  MRA findings are below.  Associated T2 and FLAIR hyperintensity plus T1 hypointensity in the acutely affected areas.  No significant mass effect. No  other acute intracranial hemorrhage.  No supratentorial diffusion abnormality.  No ventriculomegaly. Negative pituitary.  Negative cervicomedullary junction and visualized cervical spine.  Scattered patchy cerebral white matter T2 and FLAIR hyperintensity, mild to moderate for age.  No supratentorial encephalomalacia.  Visualized bone marrow signal is within normal limits.  Visualized orbit soft tissues are within normal limits.  Minor paranasal sinus mucosal thickening.  Mastoids are clear.  Negative visualized internal auditory structures.  Negative scalp soft tissues.  IMPRESSION: 1.  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 No significant mass effect.  Possible petechial hemorrhage in the left cerebellar vermis.  Evidence of left PICA or a PICA branch slow flow or occlusion. 2.  No other acute intracranial abnormality.  Intracranial artery dolichoectasia.  See MRA findings below.  3.  Mild to moderate for age nonspecific cerebral white matter signal changes, favor  chronic small vessel disease.  MRA HEAD WITHOUT CONTRAST  Technique: Angiographic images of the Circle of Willis were obtained using MRA technique without  intravenous contrast.  Findings: Antegrade flow in codominant distal vertebral arteries. Moderate to severe tortuosity of the V4 segments and vertebrobasilar junction.  No definite stenosis; mild asymmetric signal intensity is felt related to the vessel tortuosity.  No PICA flow signal identified on either side.  No definite AICA flow signal.  No basilar artery stenosis.  SCA and PCA origins are within normal limits.  Posterior communicating arteries are diminutive or absent.  Bilateral PCA branches are within normal limits.  Antegrade flow in both ICA siphons.  Mildly tortuous distal cervical right ICA.  Mild to moderate cavernous ICA tortuosity. Both ophthalmic artery origins are within normal limits.  Normal carotid termini, MCA and ACA origins.  Anterior communicating artery is diminutive or absent.  Tortuous proximal ACA branches. Bilateral MCA M1 segments are within normal limits.  Mild irregularity of the bilateral M2 segments.  Otherwise negative visualized bilateral MCA branches.  IMPRESSION: 1.  No bilateral PICA or AICA flow signal detected.  Tortuous distal vertebral arteries appear to be patent without definite lesion. 2.  Otherwise negative posterior circulation. 3.  Mild to moderate ICA siphon tortuosity.  Mild bilateral MCA M2 branch irregularity. 4.  Otherwise negative anterior circulation.  Salient findings on this study discussed on 12/08/2012 at 2215 hours with PA Sharilyn Sites in the ED.   Original Report Authenticated By: Erskine Speed, M.D.     Microbiology: No results found for this or any previous visit (from the past 240 hour(s)).   Labs: Basic Metabolic Panel:  Recent Labs Lab 12/08/12 1802  NA 142  K 4.1  CL 100  CO2 29  GLUCOSE 256*  BUN 26*  CREATININE 0.99  CALCIUM 10.4   Liver Function Tests: No results found for  this basename: AST, ALT, ALKPHOS, BILITOT, PROT, ALBUMIN,  in the last 168 hours No results found for this basename: LIPASE, AMYLASE,  in the last 168 hours No results found for this basename: AMMONIA,  in the last 168 hours CBC:  Recent Labs Lab 12/08/12 1802  WBC 11.0*  HGB 16.4  HCT 45.6  MCV 83.2  PLT 257   Cardiac Enzymes: No results found for this basename: CKTOTAL, CKMB, CKMBINDEX, TROPONINI,  in the last 168 hours BNP: BNP (last 3 results) No results found for this basename: PROBNP,  in the last 8760 hours CBG:  Recent Labs Lab 12/10/12 0700 12/10/12 1200 12/10/12 1703 12/10/12 2124 12/11/12 0657  GLUCAP 171* 160* 125* 212* 185*  Signed:  Marinda Elk  [facility] 12/11/2012, 12:39 PM

## 2013-01-18 ENCOUNTER — Telehealth: Payer: Self-pay

## 2013-01-18 NOTE — Telephone Encounter (Signed)
Pt states since stroke, he is having dizziness after any kind of activity such as lifting. Requesting earlier appt w/ Dr. Pearlean Brownie. Advsd not to lift, go to PCP, ER, or Urgent Care if sx(s) remain or increase. Will fwd messge to Dr. Pearlean Brownie medical asst., to be placed on cancellation list. Pt agreed.

## 2013-01-18 NOTE — Telephone Encounter (Signed)
Message copied by Doree Barthel on Mon Jan 18, 2013 11:22 AM ------      Message from: Wallace Going B      Created: Mon Jan 18, 2013  9:07 AM      Contact: PATIENT       PT REQUESTS CALL BACK--HAS QUESTIONS ABOUT CONDITION-HAS APPT IN July--[phone]) ------

## 2013-04-15 ENCOUNTER — Encounter: Payer: Self-pay | Admitting: Neurology

## 2013-04-15 ENCOUNTER — Ambulatory Visit (INDEPENDENT_AMBULATORY_CARE_PROVIDER_SITE_OTHER): Payer: Self-pay | Admitting: Neurology

## 2013-04-15 VITALS — BP 150/96 | HR 84 | Temp 98.7°F | Ht 67.0 in | Wt 199.0 lb

## 2013-04-15 DIAGNOSIS — I6350 Cerebral infarction due to unspecified occlusion or stenosis of unspecified cerebral artery: Secondary | ICD-10-CM

## 2013-04-15 NOTE — Patient Instructions (Addendum)
Aspirin for stroke prevention and strict control of hypertension with blood pressure goal below 120/80 and lipids with LDL cholesterol goal below 70 mg percent and diabetes with hemoglobin A1c goal below 6.5%. I have counseled the patient to quit smoking completely. Check transcranial Doppler bubble study with emboli monitoring. Return for followup in the future as needed.

## 2013-04-17 NOTE — Progress Notes (Signed)
[facility] [address] [place]. [place] [address] [phone]       OFFICE FOLLOW-UP NOTE  Mr. Gregory Gordon Date of Birth:  09/20/1961 Medical Record Number:  [id number]   HPI: Mr Cowger is seen today for the first of referring hospital admission for stroke in March 2014. He presented with 3-4 day history of dizziness and gait imbalance difficulties with nausea and vomiting. MRI scan of the brain showed acute large left inferior cerebellar and small right cerebellar acute infarcts as well as old bilateral cerebellar infarcts. MRA of the brain showed absence of both posterior inferior cerebellar and anterior inferior cerebellar arteries but otherwise no large vessel stenosis. Transthoracic echo showed normal ejection fraction. Transesophageal echo showed no cardiac source of embolism but patent foramen ovale was noted. Carotid ultrasound showed no significant extracranial stenosis. Hemoglobin A1c was elevated at 7.6. Lower extremity venous Dopplers were negative for DVT. Patient was started on aspirin for stroke prevention I advised maintain strict control of hypertension, diabetes and lipids. He states his one done well since discharge. He gets occasional dizzy spells usually when he is looking up for long periods only. He states his sugars have been better controlled his fasting sugars range from 130 to 150s yet. He states his blood pressure is better though it is elevated at 150/96 office today. He continues to smoke one pack per day and had actually quit for 2 weeks but was unable to maintain that. He has returned back to work without restrictions this week.  ROS:   14 system review of systems is positive for feeling hot, trouble swallowing, mild balance difficulties and dizziness  PMH:  Past Medical History  Diagnosis Date  . Hypertension   . Hyperlipemia   . High cholesterol     Social History:  History   Social History  . Marital Status: Married    Spouse  Name: N/A    Number of Children: N/A  . Years of Education: N/A   Occupational History  . Not on file.   Social History Main Topics  . Smoking status: Current Every Day Smoker -- 1.00 packs/day    Types: Cigarettes  . Smokeless tobacco: Not on file  . Alcohol Use: No  . Drug Use: No  . Sexually Active: Not on file   Other Topics Concern  . Not on file   Social History Narrative  . No narrative on file    Medications:   Current Outpatient Prescriptions on File Prior to Visit  Medication Sig Dispense Refill  . aspirin 81 MG tablet Take 2 tablets (162 mg total) by mouth daily.  30 tablet  0  . losartan-hydrochlorothiazide (HYZAAR) 50-12.5 MG per tablet Take 1 tablet by mouth every evening.      . meclizine (ANTIVERT) 25 MG tablet Take 25 mg by mouth 3 (three) times daily.      . metFORMIN (GLUCOPHAGE) 500 MG tablet Take 1 tablet (500 mg total) by mouth 2 (two) times daily with a meal.  60 tablet  0  . niacin 500 MG tablet Take 1 tablet (500 mg total) by mouth at bedtime.  30 tablet  0  . pravastatin (PRAVACHOL) 20 MG tablet Take 20 mg by mouth every other day.       No current facility-administered medications on file prior to visit.    Allergies:  No Known Allergies  Physical Exam General: well developed, well nourished, seated, in no evident distress Head: head normocephalic and atraumatic. Orohparynx  benign Neck: supple with no carotid or supraclavicular bruits Cardiovascular: regular rate and rhythm, no murmurs Musculoskeletal: no deformity Skin:  no rash/petichiae Vascular:  Normal pulses all extremities Filed Vitals:   04/15/13 1531  BP: 150/96  Pulse: 84  Temp: 98.7 F (37.1 C)    Neurologic Exam Mental Status: Awake and fully alert. Oriented to place and time. Recent and remote memory intact. Attention span, concentration and fund of knowledge appropriate. Mood and affect appropriate.  Cranial Nerves: Fundoscopic exam reveals sharp disc margins. Pupils  equal, briskly reactive to light. Extraocular movements full without nystagmus. Visual fields full to confrontation. Hearing intact. Facial sensation intact. Face, tongue, palate moves normally and symmetrically.  Motor: Normal bulk and tone. Normal strength in all tested extremity muscles. Sensory.: intact to tough and pinprick and vibratory.  Coordination: Rapid alternating movements normal in all extremities. Finger-to-nose and heel-to-shin performed accurately bilaterally. Gait and Station: Arises from chair without difficulty. Stance is normal. Gait demonstrates normal stride length and balance . Not able to heel, toe and tandem walk without difficulty.  Reflexes: 1+ and symmetric. Toes downgoing.     ASSESSMENT:  13 a well male with an acute and subacute cerebellar infarcts in March 2014 with multiple vascular risk factors of diabetes, hypertension, hyperlipidemia and possible coronary artery disease. Small patent foramen ovale.   PLAN:  Aspirin for stroke prevention and strict control of hypertension with blood pressure goal below 120/80 and lipids with LDL cholesterol goal below 70 mg percent and diabetes with hemoglobin A1c goal below 6.5%. I have counseled the patient to quit smoking completely. Check transcranial Doppler bubble study with emboli monitoring. Return for followup in the future as needed.

## 2017-06-30 ENCOUNTER — Other Ambulatory Visit (HOSPITAL_COMMUNITY): Payer: Self-pay | Admitting: Internal Medicine

## 2017-06-30 DIAGNOSIS — K7689 Other specified diseases of liver: Secondary | ICD-10-CM

## 2017-07-09 ENCOUNTER — Ambulatory Visit (HOSPITAL_COMMUNITY)
Admission: RE | Admit: 2017-07-09 | Discharge: 2017-07-09 | Disposition: A | Discharge status: Home / Self Care | Payer: BLUE CROSS/BLUE SHIELD | Source: Ambulatory Visit | Attending: Internal Medicine | Admitting: Internal Medicine

## 2017-07-09 DIAGNOSIS — K7689 Other specified diseases of liver: Secondary | ICD-10-CM

## 2017-07-09 DIAGNOSIS — K8020 Calculus of gallbladder without cholecystitis without obstruction: Secondary | ICD-10-CM | POA: Diagnosis not present

## 2017-12-15 ENCOUNTER — Emergency Department (HOSPITAL_COMMUNITY): Payer: BLUE CROSS/BLUE SHIELD

## 2017-12-15 ENCOUNTER — Other Ambulatory Visit: Payer: Self-pay

## 2017-12-15 ENCOUNTER — Emergency Department (HOSPITAL_COMMUNITY)
Admission: EM | Admit: 2017-12-15 | Discharge: 2017-12-15 | Disposition: A | Discharge status: Home / Self Care | Payer: BLUE CROSS/BLUE SHIELD | Attending: Emergency Medicine | Admitting: Emergency Medicine

## 2017-12-15 ENCOUNTER — Encounter (HOSPITAL_COMMUNITY): Payer: Self-pay | Admitting: Emergency Medicine

## 2017-12-15 DIAGNOSIS — Z7982 Long term (current) use of aspirin: Secondary | ICD-10-CM | POA: Insufficient documentation

## 2017-12-15 DIAGNOSIS — I10 Essential (primary) hypertension: Secondary | ICD-10-CM | POA: Diagnosis not present

## 2017-12-15 DIAGNOSIS — Z7984 Long term (current) use of oral hypoglycemic drugs: Secondary | ICD-10-CM | POA: Insufficient documentation

## 2017-12-15 DIAGNOSIS — X500XXA Overexertion from strenuous movement or load, initial encounter: Secondary | ICD-10-CM | POA: Insufficient documentation

## 2017-12-15 DIAGNOSIS — E119 Type 2 diabetes mellitus without complications: Secondary | ICD-10-CM | POA: Insufficient documentation

## 2017-12-15 DIAGNOSIS — F17210 Nicotine dependence, cigarettes, uncomplicated: Secondary | ICD-10-CM | POA: Diagnosis not present

## 2017-12-15 DIAGNOSIS — M25512 Pain in left shoulder: Secondary | ICD-10-CM

## 2017-12-15 DIAGNOSIS — Z8673 Personal history of transient ischemic attack (TIA), and cerebral infarction without residual deficits: Secondary | ICD-10-CM | POA: Diagnosis not present

## 2017-12-15 DIAGNOSIS — Z79899 Other long term (current) drug therapy: Secondary | ICD-10-CM | POA: Insufficient documentation

## 2017-12-15 HISTORY — DX: Cerebral infarction, unspecified: I63.9

## 2017-12-15 HISTORY — DX: Type 2 diabetes mellitus without complications: E11.9

## 2017-12-15 LAB — CBG MONITORING, ED: GLUCOSE-CAPILLARY: 108 mg/dL — AB (ref 65–99)

## 2017-12-15 MED ORDER — HYDROCODONE-ACETAMINOPHEN 5-325 MG PO TABS: 1.0000 | ORAL_TABLET | ORAL | 0 refills | Status: DC | PRN

## 2017-12-15 MED ORDER — METHOCARBAMOL 500 MG PO TABS: 500.0000 mg | ORAL_TABLET | Freq: Two times a day (BID) | ORAL | 0 refills | Status: DC

## 2017-12-15 MED ORDER — DICLOFENAC SODIUM 75 MG PO TBEC: 75.0000 mg | DELAYED_RELEASE_TABLET | Freq: Two times a day (BID) | ORAL | 0 refills | Status: DC

## 2017-12-15 NOTE — ED Triage Notes (Signed)
Pt c/o left shoulder pain x 2 weeks, pt was at work picking transmissions up.

## 2017-12-15 NOTE — ED Provider Notes (Signed)
[facility] EMERGENCY DEPARTMENT Provider Note   CSN: [id number] Arrival date & time: 12/15/17  1823     History   Chief Complaint Chief Complaint  Patient presents with  . Shoulder Pain    HPI Gregory Gordon is a 57 y.o. male.  The history is provided by the patient. No language interpreter was used.  Shoulder Pain   This is a new problem. Episode onset: 2 weeks ago. The problem occurs constantly. The problem has been gradually worsening. The pain is present in the left shoulder. The quality of the pain is described as aching. The pain is moderate. He has tried nothing for the symptoms. The treatment provided no relief. There has been a history of trauma.  Pt dropped a transmission and pulled his shoulder.    Past Medical History:  Diagnosis Date  . Diabetes mellitus without complication ([place])   . High cholesterol   . Hyperlipemia   . Hypertension   . Stroke [facility])     Patient Active Problem List   Diagnosis Date Noted  . Arterial ischemic stroke, vertebrobasilar, cerebellar, acute ([place]) 12/08/2012  . DM2 (diabetes mellitus, type 2) ([place]) 12/08/2012  . Dyslipidemia 12/08/2012  . HTN (hypertension) 12/08/2012  . Family history of early CAD 12/08/2012  . Q waves suggestive of previous myocardial infarction 12/08/2012    Past Surgical History:  Procedure Laterality Date  . neg hx    . TEE WITHOUT CARDIOVERSION N/A 12/10/2012   Procedure: TRANSESOPHAGEAL ECHOCARDIOGRAM (TEE);  Surgeon: [name], MD;  Location: [facility] ENDOSCOPY;  Service: Cardiovascular;  Laterality: N/A;  Rm 4N24        Home Medications    Prior to Admission medications   Medication Sig Start Date End Date Taking? Authorizing Provider  aspirin 81 MG tablet Take 2 tablets (162 mg total) by mouth daily. 12/11/12   Charlynne Cousins, MD  losartan-hydrochlorothiazide (HYZAAR) 50-12.5 MG per tablet Take 1 tablet by mouth every evening.    [provider]  meclizine (ANTIVERT) 25 MG  tablet Take 25 mg by mouth 3 (three) times daily.    [provider]  metFORMIN (GLUCOPHAGE) 500 MG tablet Take 1 tablet (500 mg total) by mouth 2 (two) times daily with a meal. 12/11/12   Charlynne Cousins, MD  Naproxen Sodium (ALEVE PO) Take by mouth 2 (two) times daily.    [provider]  niacin 500 MG tablet Take 1 tablet (500 mg total) by mouth at bedtime. 12/11/12   Charlynne Cousins, MD  pravastatin (PRAVACHOL) 20 MG tablet Take 20 mg by mouth every other day.    [provider]    Family History Family History  Problem Relation Age of Onset  . Heart attack Brother 58       Younger brother just had 2nd MI    Social History Social History   Tobacco Use  . Smoking status: Current Every Day Smoker    Packs/day: 1.00    Types: Cigarettes  . Smokeless tobacco: Never Used  Substance Use Topics  . Alcohol use: No  . Drug use: No     Allergies   Patient has no known allergies.   Review of Systems Review of Systems  Musculoskeletal: Positive for joint swelling and myalgias.  All other systems reviewed and are negative.    Physical Exam Updated Vital Signs BP (!) 178/108 (BP Location: Right Arm)   Pulse 74   Temp 98.4 F (36.9 C) (Oral)   Resp  16   SpO2 97%   Physical Exam   ED Treatments / Results  Labs (all labs ordered are listed, but only abnormal results are displayed) Labs Reviewed  CBG MONITORING, ED - Abnormal; Notable for the following components:      Result Value   Glucose-Capillary 108 (*)    All other components within normal limits    EKG None  Radiology Dg Shoulder Left  Result Date: 12/15/2017 CLINICAL DATA:  Shoulder pain EXAM: LEFT SHOULDER - 2+ VIEW COMPARISON:  None. FINDINGS: Mild AC joint degenerative change. No fracture or malalignment. Left lung apex is clear. Mild glenohumeral degenerative change. IMPRESSION: No acute osseous abnormality. Electronically Signed   By: Donavan Foil M.D.   On:  12/15/2017 18:51    Procedures Procedures (including critical care time)  Medications Ordered in ED Medications - No data to display   Initial Impression / Assessment and Plan / ED Course  I have reviewed the triage vital signs and the nursing notes.  Pertinent labs & imaging results that were available during my care of the patient were reviewed by me and considered in my medical decision making (see chart for details).   MDM  Xray reviewed, no fracture.  Pt is tender over AC joint.  I suspect AC joint injury.   Pt placed in a sling Pt advised to follow up with Dr. Aline Brochure for evaluation     Final Clinical Impressions(s) / ED Diagnoses   Final diagnoses:  Acute pain of left shoulder    ED Discharge Orders        Ordered    HYDROcodone-acetaminophen (NORCO/VICODIN) 5-325 MG tablet  Every 4 hours PRN     12/15/17 1959    diclofenac (VOLTAREN) 75 MG EC tablet  2 times daily     12/15/17 1959    methocarbamol (ROBAXIN) 500 MG tablet  2 times daily     12/15/17 1959    An After Visit Summary was printed and given to the patient.    Sidney Ace 12/15/17 Norton Pastel, MD 12/16/17 [phone]

## 2017-12-15 NOTE — Discharge Instructions (Signed)
Schedule to see the Orthopaedist for evaluation.  Call tomorrow to schedule an appointment.

## 2017-12-24 ENCOUNTER — Encounter: Payer: Self-pay | Admitting: Orthopedic Surgery

## 2017-12-24 ENCOUNTER — Ambulatory Visit (INDEPENDENT_AMBULATORY_CARE_PROVIDER_SITE_OTHER): Payer: BLUE CROSS/BLUE SHIELD | Admitting: Orthopedic Surgery

## 2017-12-24 VITALS — BP 170/101 | HR 76 | Ht 68.0 in | Wt 188.0 lb

## 2017-12-24 DIAGNOSIS — M25512 Pain in left shoulder: Secondary | ICD-10-CM

## 2017-12-24 MED ORDER — DICLOFENAC SODIUM 75 MG PO TBEC: 75.0000 mg | DELAYED_RELEASE_TABLET | Freq: Two times a day (BID) | ORAL | 0 refills | Status: DC

## 2017-12-24 MED ORDER — METHOCARBAMOL 500 MG PO TABS: 500.0000 mg | ORAL_TABLET | Freq: Two times a day (BID) | ORAL | 0 refills | Status: DC

## 2017-12-24 NOTE — Addendum Note (Signed)
Addended byCandice Camp on: 12/24/2017 02:21 PM   Modules accepted: Orders

## 2017-12-24 NOTE — Progress Notes (Signed)
NEW PATIENT OFFICE VISIT   Chief Complaint  Patient presents with  . Shoulder Pain    left shoulder pain x 3weeks, injury at work lifted a transmission     57 year old male comes in today for evaluation of left shoulder pain  He works at [employer] he was lifting a transmission it jerked his arm he had some mild discomfort then it got worse over couple of days and he went to the ER on March 27 with a date of injury approximately 3 weeks ago.  He complains of pain along the shoulder blade clavicle and anterior arm.  No weakness no loss of motion he did take some Robaxin and diclofenac which seemed to help   Review of Systems  All other systems reviewed and are negative.    Past Medical History:  Diagnosis Date  . Diabetes mellitus without complication ([place])   . High cholesterol   . Hyperlipemia   . Hypertension   . Stroke [facility])     Past Surgical History:  Procedure Laterality Date  . neg hx    . TEE WITHOUT CARDIOVERSION N/A 12/10/2012   Procedure: TRANSESOPHAGEAL ECHOCARDIOGRAM (TEE);  Surgeon: [name], MD;  Location: [facility] ENDOSCOPY;  Service: Cardiovascular;  Laterality: N/A;  Rm 4N24    Family History  Problem Relation Age of Onset  . Heart attack Brother 28       Younger brother just had 2nd MI   Social History   Tobacco Use  . Smoking status: Current Every Day Smoker    Packs/day: 1.00    Types: Cigarettes  . Smokeless tobacco: Never Used  Substance Use Topics  . Alcohol use: No  . Drug use: No    @ALL @  Current Meds  Medication Sig  . aspirin 325 MG tablet Take 325 mg by mouth daily.  . cloNIDine (CATAPRES) 0.1 MG tablet TK 1 T PO QAM AND 2 TS PO QHS  . diclofenac (VOLTAREN) 75 MG EC tablet Take 1 tablet (75 mg total) by mouth 2 (two) times daily.  Marland Kitchen glimepiride (AMARYL) 4 MG tablet TK 1 T PO BID FOR DIABETES  . losartan-hydrochlorothiazide (HYZAAR) 100-12.5 MG tablet TK 1 T PO QD FOR BP  . metFORMIN (GLUCOPHAGE-XR) 500 MG 24 hr tablet  TK 1 T PO QHS  . methocarbamol (ROBAXIN) 500 MG tablet Take 1 tablet (500 mg total) by mouth 2 (two) times daily.  . pravastatin (PRAVACHOL) 40 MG tablet TK 1 T PO QHS    BP (!) 170/101   Pulse 76   Ht 5\' 8"  (1.727 m)   Wt 188 lb (85.3 kg)   BMI 28.59 kg/m   Physical Exam  Constitutional: He is oriented to person, place, and time. He appears well-developed and well-nourished.  Vital signs have been reviewed and are stable. Gen. appearance the patient is well-developed and well-nourished with normal grooming and hygiene.   Neurological: He is alert and oriented to person, place, and time.  Skin: Skin is warm and dry. No erythema.  Psychiatric: He has a normal mood and affect.  Vitals reviewed.   Ortho Exam  Left shoulder no tenderness to palpation full range of motion normal strength no instability normal skin normal test normal axilla in terms of lymph nodes normal  Range of motion  of the right shoulder is normal as is the strength MEDICAL DECISION SECTION  xrays ordered?  No  My independent reading of xrays: 3 views of the left shoulder done at the hospital  x-rays are negative for fracture dislocation or arthritis   Encounter Diagnosis  Name Primary?  . Acute pain of left shoulder Yes     PLAN:   No orders of the defined types were placed in this encounter.  Injection? no MRI/CT/? No  I did not find any torn tendons muscles or weakness.  I certainly think something did happen to his shoulder we cannot pinpoint  I would recommend that he continue diclofenac and Robaxin for now.  As needed.  Follow-up as needed.

## 2018-01-12 ENCOUNTER — Ambulatory Visit (INDEPENDENT_AMBULATORY_CARE_PROVIDER_SITE_OTHER): Payer: BLUE CROSS/BLUE SHIELD | Admitting: Otolaryngology

## 2018-01-12 DIAGNOSIS — D3709 Neoplasm of uncertain behavior of other specified sites of the oral cavity: Secondary | ICD-10-CM

## 2018-03-19 ENCOUNTER — Emergency Department
Admission: EM | Admit: 2018-03-19 | Discharge: 2018-03-19 | Disposition: A | Discharge status: Home / Self Care | Payer: BLUE CROSS/BLUE SHIELD | Attending: Emergency Medicine | Admitting: Emergency Medicine

## 2018-03-19 DIAGNOSIS — Y999 Unspecified external cause status: Secondary | ICD-10-CM | POA: Insufficient documentation

## 2018-03-19 DIAGNOSIS — Y9389 Activity, other specified: Secondary | ICD-10-CM | POA: Insufficient documentation

## 2018-03-19 DIAGNOSIS — I10 Essential (primary) hypertension: Secondary | ICD-10-CM | POA: Diagnosis not present

## 2018-03-19 DIAGNOSIS — Y929 Unspecified place or not applicable: Secondary | ICD-10-CM | POA: Insufficient documentation

## 2018-03-19 DIAGNOSIS — Z23 Encounter for immunization: Secondary | ICD-10-CM | POA: Diagnosis not present

## 2018-03-19 DIAGNOSIS — Z8673 Personal history of transient ischemic attack (TIA), and cerebral infarction without residual deficits: Secondary | ICD-10-CM | POA: Diagnosis not present

## 2018-03-19 DIAGNOSIS — E119 Type 2 diabetes mellitus without complications: Secondary | ICD-10-CM | POA: Diagnosis not present

## 2018-03-19 DIAGNOSIS — Z7982 Long term (current) use of aspirin: Secondary | ICD-10-CM | POA: Insufficient documentation

## 2018-03-19 DIAGNOSIS — S51812A Laceration without foreign body of left forearm, initial encounter: Secondary | ICD-10-CM | POA: Diagnosis not present

## 2018-03-19 DIAGNOSIS — F17210 Nicotine dependence, cigarettes, uncomplicated: Secondary | ICD-10-CM | POA: Insufficient documentation

## 2018-03-19 DIAGNOSIS — W268XXA Contact with other sharp object(s), not elsewhere classified, initial encounter: Secondary | ICD-10-CM | POA: Diagnosis not present

## 2018-03-19 DIAGNOSIS — Z79899 Other long term (current) drug therapy: Secondary | ICD-10-CM | POA: Insufficient documentation

## 2018-03-19 MED ORDER — TETANUS-DIPHTH-ACELL PERTUSSIS 5-2.5-18.5 LF-MCG/0.5 IM SUSP
INTRAMUSCULAR | Status: AC
  Filled 2018-03-19: qty 0.5

## 2018-03-19 MED ORDER — LIDOCAINE HCL (PF) 1 % IJ SOLN
INTRAMUSCULAR | Status: AC
  Administered 2018-03-19: 5 mL
  Filled 2018-03-19: qty 5

## 2018-03-19 MED ORDER — LIDOCAINE 5 % EX PTCH
MEDICATED_PATCH | CUTANEOUS | Status: AC
  Filled 2018-03-19: qty 1

## 2018-03-19 MED ORDER — CEPHALEXIN 500 MG PO CAPS: 500.0000 mg | ORAL_CAPSULE | Freq: Three times a day (TID) | ORAL | 0 refills | Status: AC

## 2018-03-19 MED ORDER — TETANUS-DIPHTH-ACELL PERTUSSIS 5-2.5-18.5 LF-MCG/0.5 IM SUSP
0.5000 mL | Freq: Once | INTRAMUSCULAR | Status: AC
Start: 2018-03-19 — End: 2018-03-19
  Administered 2018-03-19: 0.5 mL via INTRAMUSCULAR

## 2018-03-19 MED ORDER — LIDOCAINE HCL 1 % IJ SOLN
5.0000 mL | Freq: Once | INTRAMUSCULAR | Status: AC
  Filled 2018-03-19: qty 5

## 2018-03-19 NOTE — ED Provider Notes (Signed)
[facility] Emergency Department Provider Note  ____________________________________________  Time seen: Approximately 9:36 PM  I have reviewed the triage vital signs and the nursing notes.   HISTORY  Chief Complaint Laceration    HPI Gregory Gordon is a 57 y.o. male presents to the emergency department with a 2-1/2 cm left forearm laceration after patient was cutting a tie strap with a clean razor blade and missed the strap.  He denies weakness, radiculopathy or changes in sensation of the upper extremities.  Patient reports that he takes aspirin daily and has had difficulty achieving hemostasis.  His tetanus status is out of date.  No alleviating measures have been attempted.   Past Medical History:  Diagnosis Date  . Diabetes mellitus without complication ([name])   . High cholesterol   . Hyperlipemia   . Hypertension   . Stroke [name])     Patient Active Problem List   Diagnosis Date Noted  . Arterial ischemic stroke, vertebrobasilar, cerebellar, acute ([name]) 12/08/2012  . DM2 (diabetes mellitus, type 2) ([name]) 12/08/2012  . Dyslipidemia 12/08/2012  . HTN (hypertension) 12/08/2012  . Family history of early CAD 12/08/2012  . Q waves suggestive of previous myocardial infarction 12/08/2012    Past Surgical History:  Procedure Laterality Date  . neg hx    . TEE WITHOUT CARDIOVERSION N/A 12/10/2012   Procedure: TRANSESOPHAGEAL ECHOCARDIOGRAM (TEE);  Surgeon: [name], MD;  Location[facility] ENDOSCOPY;  Service: Cardiovascular;  Laterality: N/A;  Rm 4N24    Prior to Admission medications   Medication Sig Start Date End Date Taking? Authorizing Provider  aspirin 325 MG tablet Take 325 mg by mouth daily.    [provider]  cephALEXin (KEFLEX) 500 MG capsule Take 1 capsule (500 mg total) by mouth 3 (three) times daily for 10 days. 03/19/18 03/29/18  [name], PA-C  cloNIDine (CATAPRES) 0.1 MG tablet TK 1 T PO QAM AND 2 TS PO QHS 12/04/17    [provider]  diclofenac (VOLTAREN) 75 MG EC tablet Take 1 tablet (75 mg total) by mouth 2 (two) times daily. 12/24/17   [name], MD  glimepiride (AMARYL) 4 MG tablet TK 1 T PO BID FOR DIABETES 12/16/17   [provider]  HYDROcodone-acetaminophen (NORCO/VICODIN) 5-325 MG tablet Take 1 tablet by mouth every 4 (four) hours as needed. Patient not taking: Reported on 12/24/2017 12/15/17   [name], PA-C  losartan-hydrochlorothiazide (HYZAAR) 100-12.5 MG tablet TK 1 T PO QD FOR BP 10/09/17   [provider]  metFORMIN (GLUCOPHAGE-XR) 500 MG 24 hr tablet TK 1 T PO QHS 09/20/17   [provider]  methocarbamol (ROBAXIN) 500 MG tablet Take 1 tablet (500 mg total) by mouth 2 (two) times daily. 12/24/17   [name], MD  pravastatin (PRAVACHOL) 40 MG tablet TK 1 T PO QHS 12/16/17   [provider]    Allergies Patient has no known allergies.  Family History  Problem Relation Age of Onset  . Heart attack Brother 32       Younger brother just had 2nd MI    Social History Social History   Tobacco Use  . Smoking status: Current Every Day Smoker    Packs/day: 1.00    Types: Cigarettes  . Smokeless tobacco: Never Used  Substance Use Topics  . Alcohol use: No  . Drug use: No     Review of Systems  Constitutional: No fever/chills Eyes: No visual changes. No discharge  ENT: No upper respiratory complaints. Cardiovascular: no chest pain. Respiratory: no cough. No SOB. Gastrointestinal: No abdominal pain.  No nausea, no vomiting.  No diarrhea.  No constipation. Musculoskeletal: Negative for musculoskeletal pain. Skin: Patient has 2.5 cm left forearm laceration.  Neurological: Negative for headaches, focal weakness or numbness.   ____________________________________________   PHYSICAL EXAM:  VITAL SIGNS: ED Triage Vitals  Enc Vitals Group     BP 03/19/18 2009 (!) 175/104     Pulse Rate 03/19/18 2009 95     Resp  03/19/18 2009 17     Temp 03/19/18 2009 98.7 F (37.1 C)     Temp Source 03/19/18 2009 Oral     SpO2 03/19/18 2009 97 %     Weight 03/19/18 2007 185 lb (83.9 kg)     Height 03/19/18 2007 5\' 10"  (1.778 m)     Head Circumference --      Peak Flow --      Pain Score 03/19/18 2007 0     Pain Loc --      Pain Edu? --      Excl. in [place]? --      Constitutional: Alert and oriented. Well appearing and in no acute distress. Eyes: Conjunctivae are normal. PERRL. EOMI. Head: Atraumatic. Cardiovascular: Normal rate, regular rhythm. Normal S1 and S2.  Good peripheral circulation. Respiratory: Normal respiratory effort without tachypnea or retractions. Lungs CTAB. Good air entry to the bases with no decreased or absent breath sounds. Gastrointestinal: Bowel sounds 4 quadrants. Soft and nontender to palpation. No guarding or rigidity. No palpable masses. No distention. No CVA tenderness. Musculoskeletal: Full range of motion to all extremities. No gross deformities appreciated. Neurologic:  Normal speech and language. No gross focal neurologic deficits are appreciated.  Skin: Patient has 2 half centimeter left forearm laceration. Psychiatric: Mood and affect are normal. Speech and behavior are normal. Patient exhibits appropriate insight and judgement.   ____________________________________________   LABS (all labs ordered are listed, but only abnormal results are displayed)  Labs Reviewed - No data to display ____________________________________________  EKG   ____________________________________________  RADIOLOGY   No results found.  ____________________________________________    PROCEDURES  Procedure(s) performed:    Procedures  LACERATION REPAIR Performed by: [name] Authorized by: [name] Consent: Verbal consent obtained. Risks and benefits: risks, benefits and alternatives were discussed Consent given by: patient Patient identity confirmed:  provided demographic data Prepped and Draped in normal sterile fashion Wound explored  Laceration Location: Left forearm   Laceration Length: 2.5 cm  No Foreign Bodies seen or palpated  Anesthesia: local infiltration  Local anesthetic: lidocaine 1% without epinephrine  Anesthetic total: 3 ml  Irrigation method: syringe Amount of cleaning: standard  Skin closure: 4-0 Ethilon   Number of sutures: 5  Technique: Simple Interrupted   Patient tolerance: Patient tolerated the procedure well with no immediate complications.   Medications  lidocaine (LIDODERM) 5 % (has no administration in time range)  lidocaine (XYLOCAINE) 1 % (with pres) injection 5 mL (5 mLs Infiltration Given 03/19/18 2200)  Tdap (BOOSTRIX) injection 0.5 mL (0.5 mLs Intramuscular Given 03/19/18 2128)     ____________________________________________   INITIAL IMPRESSION / ASSESSMENT AND PLAN / ED COURSE  Pertinent labs & imaging results that were available during my care of the patient were reviewed by me and considered in my medical decision making (see chart for details).  Review of the [place] CSRS was performed in accordance of the [place] prior to dispensing  any controlled drugs.      Assessment and plan Left forearm laceration Patient presents to the emergency department with a 2-1/2 cm left forearm laceration repaired in the emergency department without complication.  Patient was advised to have sutures removed by primary care in 1 week.  His tetanus status was updated in the emergency department and he was discharged with Keflex.  Vital signs are reassuring prior to discharge.  All patient questions were answered.  ____________________________________________  FINAL CLINICAL IMPRESSION(S) / ED DIAGNOSES  Final diagnoses:  Laceration of left forearm, initial encounter      NEW MEDICATIONS STARTED DURING THIS VISIT:  ED Discharge Orders        Ordered    cephALEXin (KEFLEX) 500 MG capsule  3  times daily     03/19/18 2126          This chart was dictated using voice recognition software/Dragon. Despite best efforts to proofread, errors can occur which can change the meaning. Any change was purely unintentional.    [name], PA-C 03/19/18 2146    Rudene Re, MD 03/21/18 1539

## 2018-03-19 NOTE — ED Triage Notes (Signed)
Patient was attempting to cut a tie strap with a razor blade and cut left forearm. Small (1/2") laceration noted to forearm - bleeding controlled.

## 2018-06-01 ENCOUNTER — Ambulatory Visit: Payer: BLUE CROSS/BLUE SHIELD

## 2019-06-11 ENCOUNTER — Ambulatory Visit: Payer: BLUE CROSS/BLUE SHIELD | Admitting: Orthopedic Surgery

## 2019-06-14 ENCOUNTER — Ambulatory Visit (INDEPENDENT_AMBULATORY_CARE_PROVIDER_SITE_OTHER): Payer: BC Managed Care – PPO | Admitting: Orthopedic Surgery

## 2019-06-14 ENCOUNTER — Encounter: Payer: Self-pay | Admitting: Orthopedic Surgery

## 2019-06-14 ENCOUNTER — Ambulatory Visit: Payer: BC Managed Care – PPO

## 2019-06-14 ENCOUNTER — Other Ambulatory Visit: Payer: Self-pay

## 2019-06-14 VITALS — BP 156/97 | HR 87 | Ht 70.0 in | Wt 188.0 lb

## 2019-06-14 DIAGNOSIS — M25562 Pain in left knee: Secondary | ICD-10-CM

## 2019-06-14 DIAGNOSIS — M1710 Unilateral primary osteoarthritis, unspecified knee: Secondary | ICD-10-CM | POA: Diagnosis not present

## 2019-06-14 DIAGNOSIS — M23322 Other meniscus derangements, posterior horn of medial meniscus, left knee: Secondary | ICD-10-CM

## 2019-06-14 DIAGNOSIS — G8929 Other chronic pain: Secondary | ICD-10-CM | POA: Diagnosis not present

## 2019-06-14 MED ORDER — TRAMADOL-ACETAMINOPHEN 37.5-325 MG PO TABS: 1.0000 | ORAL_TABLET | Freq: Three times a day (TID) | ORAL | 0 refills | Status: AC | PRN

## 2019-06-14 NOTE — Progress Notes (Signed)
Gregory Gordon  06/14/2019  HISTORY SECTION :  Chief Complaint  Patient presents with  . Knee Pain    left/ pain goes into left foot    HPI The patient presents for evaluation of severe pain left knee after twisting his knee 3 months ago Location medial joint line pain Duration 3 months Quality aching joint Severity 8-10 Associated with weightbearing pain prior treatment diclofenac Celebrex and ibuprofen   Review of Systems  All other systems reviewed and are negative.    has a past medical history of Diabetes mellitus without complication ([place]), High cholesterol, Hyperlipemia, Hypertension, and Stroke ([place]).   Past Surgical History:  Procedure Laterality Date  . neg hx    . TEE WITHOUT CARDIOVERSION N/A 12/10/2012   Procedure: TRANSESOPHAGEAL ECHOCARDIOGRAM (TEE);  Surgeon: [name], MD;  Location: [facility] ENDOSCOPY;  Service: Cardiovascular;  Laterality: N/A;  Rm 4N24    Body mass index is 26.98 kg/m.   No Known Allergies   Current Outpatient Medications:  .  aspirin 325 MG tablet, Take 325 mg by mouth daily., Disp: , Rfl:  .  cloNIDine (CATAPRES) 0.1 MG tablet, TK 1 T PO QAM AND 2 TS PO QHS, Disp: , Rfl: 5 .  glimepiride (AMARYL) 4 MG tablet, TK 1 T PO BID FOR DIABETES, Disp: , Rfl: 5 .  losartan-hydrochlorothiazide (HYZAAR) 100-12.5 MG tablet, TK 1 T PO QD FOR BP, Disp: , Rfl: 4 .  metFORMIN (GLUCOPHAGE-XR) 500 MG 24 hr tablet, TK 1 T PO QHS, Disp: , Rfl: 6 .  methocarbamol (ROBAXIN) 500 MG tablet, Take 1 tablet (500 mg total) by mouth 2 (two) times daily., Disp: 20 tablet, Rfl: 0 .  pravastatin (PRAVACHOL) 40 MG tablet, TK 1 T PO QHS, Disp: , Rfl: 12 .  celecoxib (CELEBREX) 100 MG capsule, Take 100 mg by mouth 2 (two) times daily., Disp: , Rfl:  .  diclofenac (VOLTAREN) 75 MG EC tablet, Take 1 tablet (75 mg total) by mouth 2 (two) times daily. (Patient not taking: Reported on 06/14/2019), Disp: 20 tablet, Rfl: 0 .  HYDROcodone-acetaminophen (NORCO/VICODIN)  5-325 MG tablet, Take 1 tablet by mouth every 4 (four) hours as needed. (Patient not taking: Reported on 06/14/2019), Disp: 10 tablet, Rfl: 0 .  traMADol-acetaminophen (ULTRACET) 37.5-325 MG tablet, Take 1 tablet by mouth every 8 (eight) hours as needed for up to 7 days for severe pain., Disp: 21 tablet, Rfl: 0   PHYSICAL EXAM SECTION: 1) BP (!) 156/97   Pulse 87   Ht 5\' 10"  (1.778 m)   Wt 188 lb (85.3 kg)   BMI 26.98 kg/m   Body mass index is 26.98 kg/m. General appearance: Well-developed well-nourished no gross deformities  2) Cardiovascular normal pulse and perfusion in the lower extremities normal color without edema  3) Neurologically deep tendon reflexes are equal and normal, no sensation loss or deficits no pathologic reflexes  4) Psychological: Awake alert and oriented x3 mood and affect normal  5) Skin no lacerations or ulcerations no nodularity no palpable masses, no erythema or nodularity  6) Musculoskeletal: Gait is normal  Right knee no tenderness no effusion normal range of motion no instability normal muscle tone  Left knee medial joint line tenderness his knee does not extend fully Trace effusion  Painful McMurray's maneuver knee fully flexes  Muscle tone and strength are normal   MEDICAL DECISION SECTION:  Encounter Diagnoses  Name Primary?  . Chronic pain of left knee Yes  . 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 Primary localized osteoarthritis of knee     Imaging X-ray shows mild arthritis medial joint line left knee  Plan:  (Rx., Inj., surg., Frx, MRI/CT, XR:2)  MRI LEFT KNEE  Meds ordered this encounter  Medications  . traMADol-acetaminophen (ULTRACET) 37.5-325 MG tablet    Sig: Take 1 tablet by mouth every 8 (eight) hours as needed for up to 7 days for severe pain.    Dispense:  21 tablet    Refill:  0    10:15 AM Arther Abbott, MD  06/14/2019

## 2019-06-14 NOTE — Patient Instructions (Addendum)
You have been scheduled for an MRI scan We will call your insurance company to do a precertification to get the MRI covered You will receive a phone call regarding the date of the scan   Meniscus Tear  A meniscus tear is a knee injury that happens when a piece of the meniscus is torn. The meniscus is a thick, rubbery, wedge-shaped cartilage in the knee. Two menisci are located in each knee. They sit between the upper bone (femur) and lower bone (tibia) that make up the knee joint. Each meniscus acts as a shock absorber for the knee. A torn meniscus is one of the most common types of knee injuries. This injury can range from mild to severe. Surgery may be needed to repair a severe tear. What are the causes? This condition may be caused by any kneeling, squatting, twisting, or pivoting movement. Sports-related injuries are the most common cause. These often occur from:  Running and stopping suddenly. ? Changing direction. ? Being tackled or knocked off your feet.  Lifting or carrying heavy weights. As people get older, their menisci get thinner and weaker. In these people, tears can happen more easily, such as from climbing stairs. What increases the risk? You are more likely to develop this condition if you:  Play contact sports.  Have a job that requires kneeling or squatting.  Are male.  Are over 15 years old. What are the signs or symptoms? Symptoms of this condition include:  Knee pain, especially at the side of the knee joint. You may feel pain when the injury occurs, or you may only hear a pop and feel pain later.  A feeling that your knee is clicking, catching, locking, or giving way.  Not being able to fully bend or extend your knee.  Bruising or swelling in your knee. How is this diagnosed? This condition may be diagnosed based on your symptoms and a physical exam. You may also have tests, such as:  X-rays.  MRI.  A procedure to look inside your knee with a  narrow surgical telescope (arthroscopy). You may be referred to a knee specialist (orthopedic surgeon). How is this treated? Treatment for this injury depends on the severity of the tear. Treatment for a mild tear may include:  Rest.  Medicine to reduce pain and swelling. This is usually a nonsteroidal anti-inflammatory drug (NSAID), like ibuprofen.  A knee brace, sleeve, or wrap.  Using crutches or a walker to keep weight off your knee and to help you walk.  Exercises to strengthen your knee (physical therapy). You may need surgery if you have a severe tear or if other treatments are not working. Follow these instructions at home: If you have a brace, sleeve, or wrap:  Wear it as told by your health care provider. Remove it only as told by your health care provider.  Loosen the brace, sleeve, or wrap if your toes tingle, become numb, or turn cold and blue.  Keep the brace, sleeve, or wrap clean and dry.  If the brace, sleeve, or wrap is not waterproof: ? Do not let it get wet. ? Cover it with a watertight covering when you take a bath or shower. Managing pain and swelling   Take over-the-counter and prescription medicines only as told by your health care provider.  If directed, put ice on your knee: ? If you have a removable brace, sleeve, or wrap, remove it as told by your health care provider. ? Put ice in a plastic  bag. ? Place a towel between your skin and the bag. ? Leave the ice on for 20 minutes, 2-3 times per day.  Move your toes often to avoid stiffness and to lessen swelling.  Raise (elevate) the injured area above the level of your heart while you are sitting or lying down. Activity  Do not use the injured limb to support your body weight until your health care provider says that you can. Use crutches or a walker as told by your health care provider.  Return to your normal activities as told by your health care provider. Ask your health care provider what  activities are safe for you.  Perform range-of-motion exercises only as told by your health care provider.  Begin doing exercises to strengthen your knee and leg muscles only as told by your health care provider. After you recover, your health care provider may recommend these exercises to help prevent another injury. General instructions  Use a knee brace, sleeve, or wrap as told by your health care provider.  Ask your health care provider when it is safe to drive if you have a brace, sleeve, or wrap on your knee.  Do not use any products that contain nicotine or tobacco, such as cigarettes, e-cigarettes, and chewing tobacco. If you need help quitting, ask your health care provider.  Ask your health care provider if the medicine prescribed to you: ? Requires you to avoid driving or using heavy machinery. ? Can cause constipation. You may need to take these actions to prevent or treat constipation:  Drink enough fluid to keep your urine pale yellow.  Take over-the-counter or prescription medicines.  Eat foods that are high in fiber, such as beans, whole grains, and fresh fruits and vegetables.  Limit foods that are high in fat and processed sugars, such as fried or sweet foods.  Keep all follow-up visits as told by your health care provider. This is important. Contact a health care provider if:  You have a fever.  Your knee becomes red, tender, or swollen.  Your pain medicine is not helping.  Your symptoms get worse or do not improve after 2 weeks of home care. Summary  A meniscus tear is a knee injury that happens when a piece of the meniscus is torn.  Treatment for this injury depends on the severity of the tear. You may need surgery if you have a severe tear or if other treatments are not working.  Rest, ice, and raise (elevate) your injured knee as told by your health care provider. This will help lessen pain and swelling.  Contact a health care provider if you have new  symptoms, or your symptoms get worse or do not improve after 2 weeks of home care.  Keep all follow-up visits as told by your health care provider. This is important. This information is not intended to replace advice given to you by your health care provider. Make sure you discuss any questions you have with your health care provider. Document Released: 11/30/2002 Document Revised: 03/24/2018 Document Reviewed: 03/24/2018 Elsevier Patient Education  2020 [employer].

## 2019-06-17 ENCOUNTER — Telehealth: Payer: Self-pay | Admitting: Radiology

## 2019-06-17 ENCOUNTER — Telehealth: Payer: Self-pay

## 2019-06-17 DIAGNOSIS — G8929 Other chronic pain: Secondary | ICD-10-CM

## 2019-06-17 NOTE — Telephone Encounter (Signed)
Patient's wife returned your call and I relayed the message you had in your message. She stated she would let him know and get him to call you.

## 2019-06-17 NOTE — Telephone Encounter (Signed)
[employer] has denied the MRI scan of his knee He needs physical therapy  Order sent for therapy  I called patient to advise left message for him to call back.   Once he completes the therapy we will have to see him back to re evaluate and order MRI if he is not better

## 2019-11-05 IMAGING — DX DG SHOULDER 2+V*L*
3 series · 3 of 3 positions shown · non-contrast
Comparison: None.

CLINICAL DATA: Shoulder pain

EXAM:
LEFT SHOULDER - 2+ VIEW

[shoulder grashey]
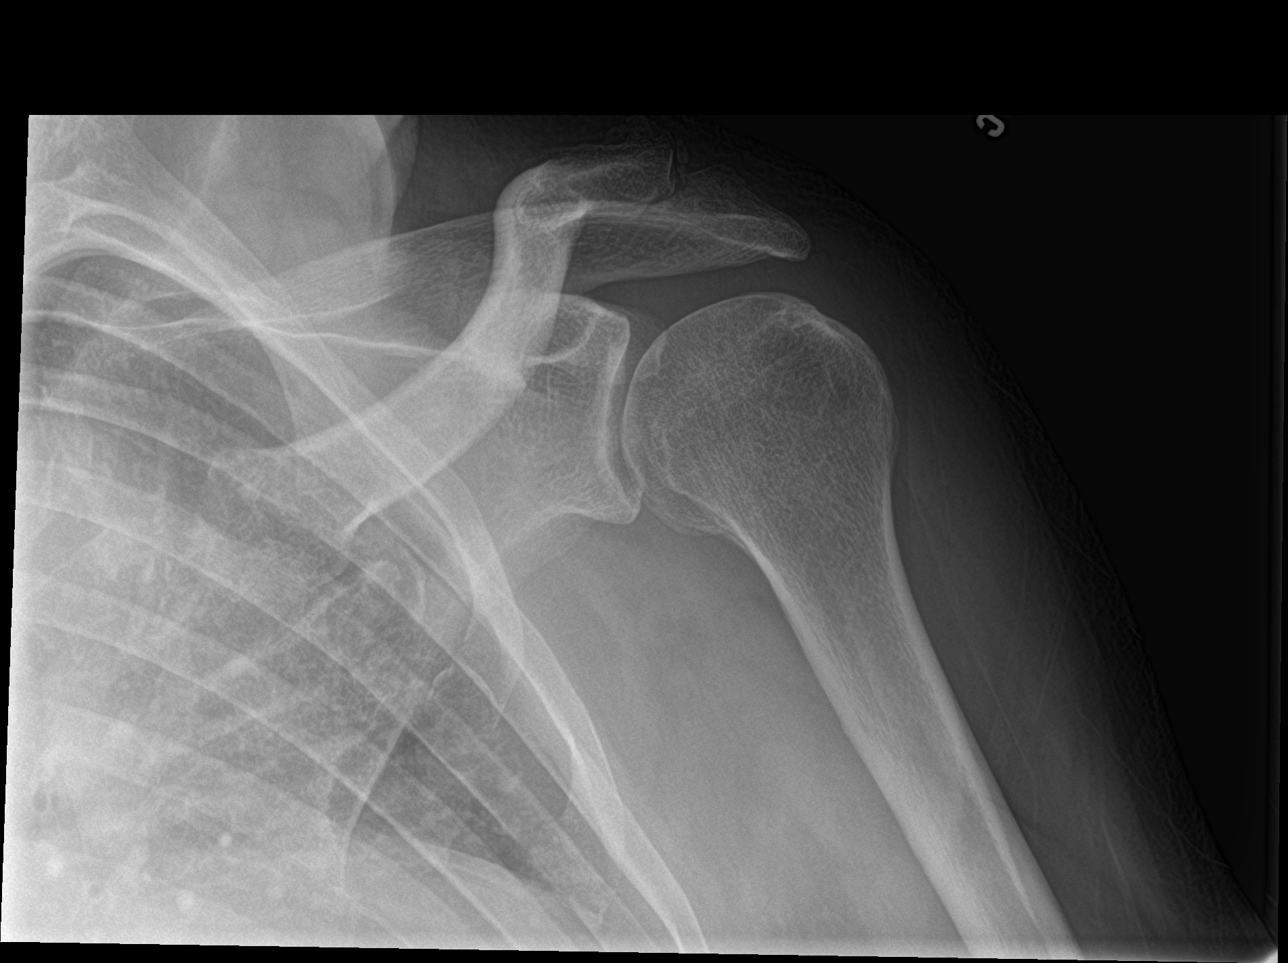

[shoulder y view]
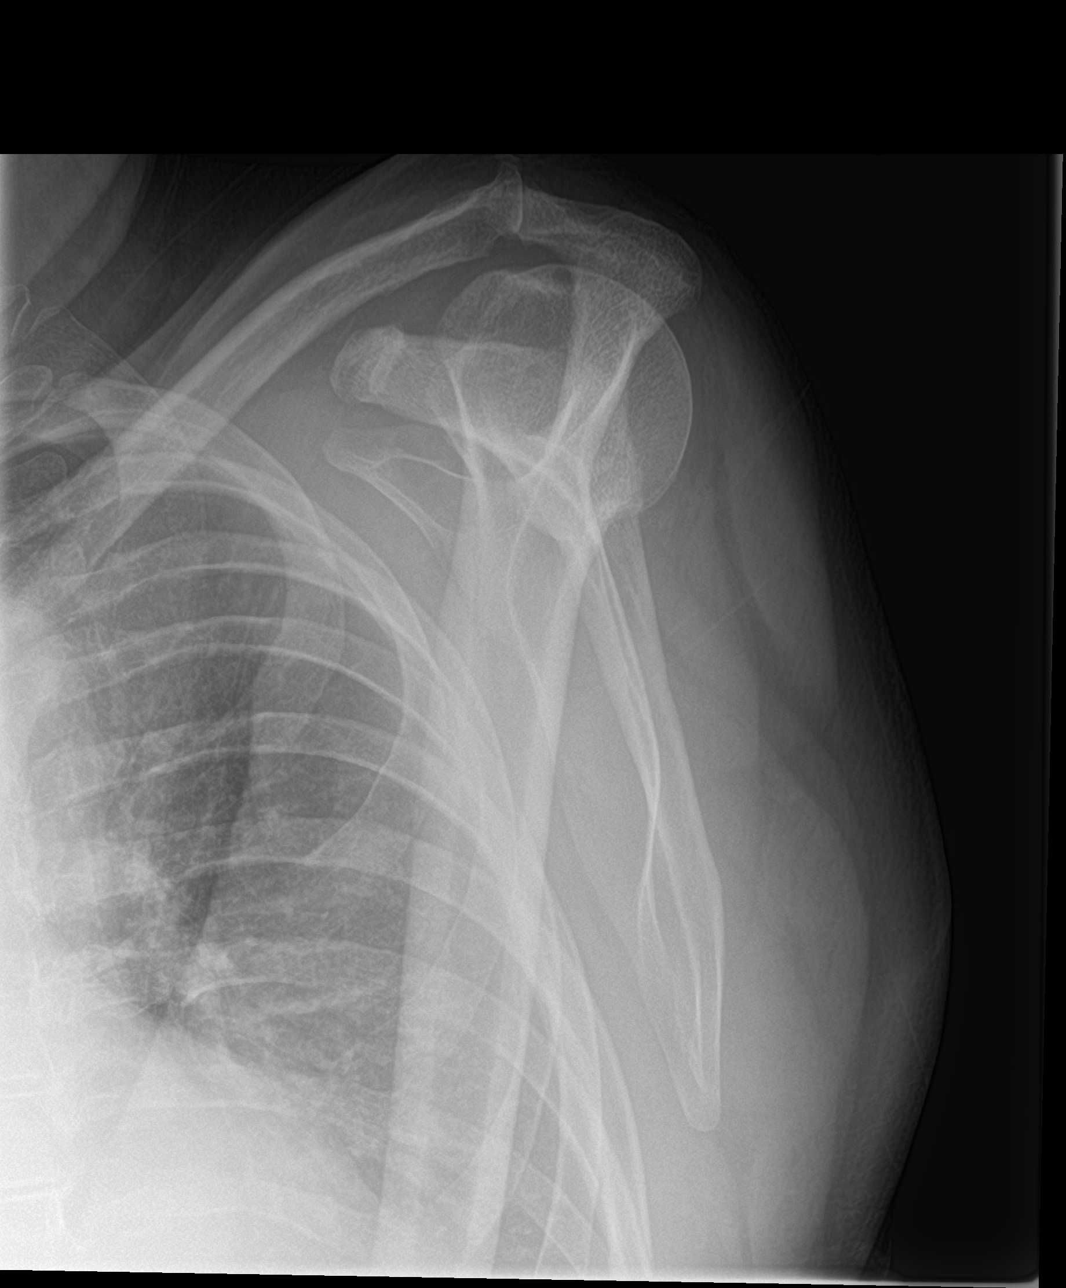

[shoulder axillary]
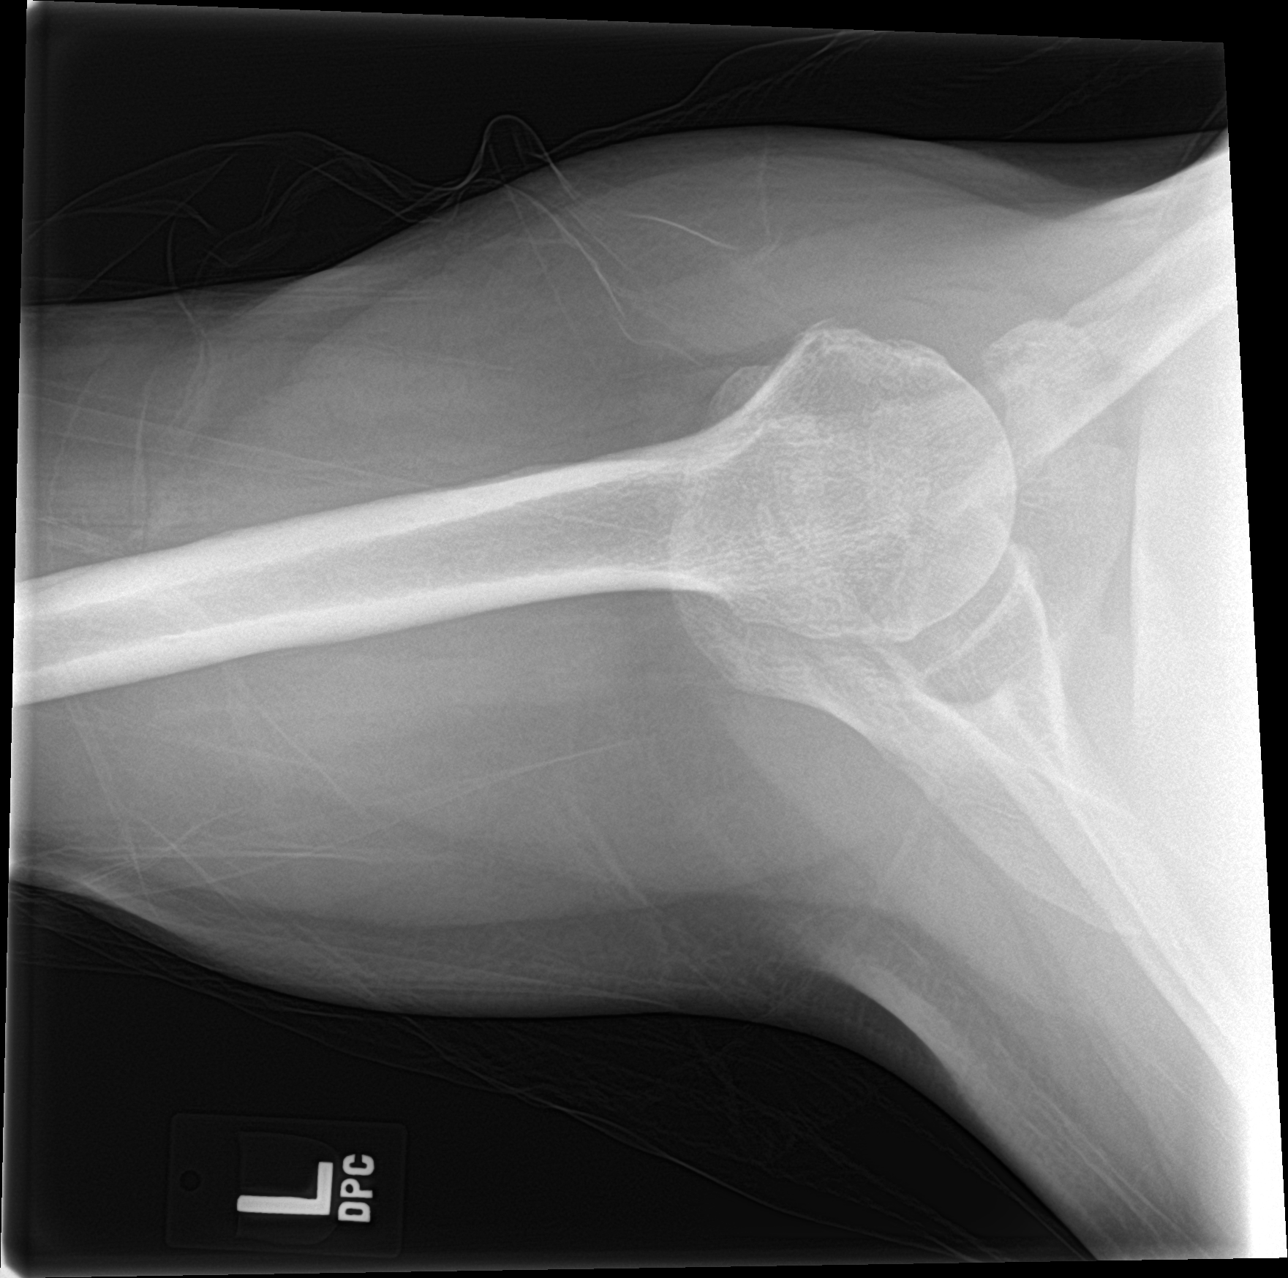

[3 of 3 positions shown; findings below may reference images not displayed]

FINDINGS: Mild AC joint degenerative change. No fracture or malalignment. Left
lung apex is clear. Mild glenohumeral degenerative change.
IMPRESSION: No acute osseous abnormality.

## 2021-06-19 LAB — COLOGUARD
Cologuard: NEGATIVE
Cologuard: NEGATIVE

## 2022-04-08 ENCOUNTER — Other Ambulatory Visit (HOSPITAL_COMMUNITY): Payer: Self-pay | Admitting: Nephrology

## 2022-04-08 ENCOUNTER — Other Ambulatory Visit: Payer: Self-pay | Admitting: Nephrology

## 2022-04-08 DIAGNOSIS — E1122 Type 2 diabetes mellitus with diabetic chronic kidney disease: Secondary | ICD-10-CM

## 2022-04-08 DIAGNOSIS — N170 Acute kidney failure with tubular necrosis: Secondary | ICD-10-CM

## 2022-04-08 DIAGNOSIS — E1129 Type 2 diabetes mellitus with other diabetic kidney complication: Secondary | ICD-10-CM

## 2022-04-17 ENCOUNTER — Ambulatory Visit (HOSPITAL_COMMUNITY)
Admission: RE | Admit: 2022-04-17 | Discharge: 2022-04-17 | Disposition: A | Discharge status: Home / Self Care | Payer: 59 | Source: Ambulatory Visit | Attending: Nephrology | Admitting: Nephrology

## 2022-04-17 DIAGNOSIS — E1129 Type 2 diabetes mellitus with other diabetic kidney complication: Secondary | ICD-10-CM

## 2022-04-17 DIAGNOSIS — R809 Proteinuria, unspecified: Secondary | ICD-10-CM | POA: Diagnosis present

## 2022-04-17 DIAGNOSIS — N170 Acute kidney failure with tubular necrosis: Secondary | ICD-10-CM

## 2022-04-17 DIAGNOSIS — E1122 Type 2 diabetes mellitus with diabetic chronic kidney disease: Secondary | ICD-10-CM

## 2022-05-08 ENCOUNTER — Other Ambulatory Visit: Payer: Self-pay

## 2022-05-08 ENCOUNTER — Encounter: Payer: Self-pay | Admitting: Cardiology

## 2022-05-08 ENCOUNTER — Ambulatory Visit (INDEPENDENT_AMBULATORY_CARE_PROVIDER_SITE_OTHER): Payer: 59 | Admitting: Cardiology

## 2022-05-08 VITALS — BP 140/80 | HR 72 | Ht 70.0 in | Wt 192.0 lb

## 2022-05-08 DIAGNOSIS — I10 Essential (primary) hypertension: Secondary | ICD-10-CM

## 2022-05-08 DIAGNOSIS — R600 Localized edema: Secondary | ICD-10-CM

## 2022-05-08 DIAGNOSIS — N1832 Chronic kidney disease, stage 3b: Secondary | ICD-10-CM

## 2022-05-08 DIAGNOSIS — G4719 Other hypersomnia: Secondary | ICD-10-CM

## 2022-05-08 MED ORDER — CARVEDILOL 3.125 MG PO TABS: 3.1250 mg | ORAL_TABLET | Freq: Two times a day (BID) | ORAL | 3 refills | Status: DC

## 2022-05-08 NOTE — Patient Instructions (Signed)
Medication Instructions:  Your physician has recommended you make the following change in your medication:  1) START taking carvedilol 3.125 mg twice daily   *If you need a refill on your cardiac medications before your next appointment, please call your pharmacy*   Lab Work: 24 hour urine at [employer] [facility] If you have labs (blood work) drawn today and your tests are completely normal, you will receive your results only by: [place] (if you have MyChart) OR A paper copy in the mail If you have any lab test that is abnormal or we need to change your treatment, we will call you to review the results.   Testing/Procedures: Your physician has requested that you have an echocardiogram. Echocardiography is a painless test that uses sound waves to create images of your heart. It provides your doctor with information about the size and shape of your heart and how well your heart's chambers and valves are working. This procedure takes approximately one hour. There are no restrictions for this procedure.  Your physician has requested that you have a renal artery duplex. During this test, an ultrasound is used to evaluate blood flow to the kidneys. Allow one hour for this exam. Do not eat after midnight the day before and avoid carbonated beverages. Take your medications as you usually do.  Your physician has recommended that you have a sleep study. This test records several body functions during sleep, including: brain activity, eye movement, oxygen and carbon dioxide blood levels, heart rate and rhythm, breathing rate and rhythm, the flow of air through your mouth and nose, snoring, body muscle movements, and chest and belly movement.  Follow-Up: At [facility], you and your health needs are our priority.  As part of our continuing mission to provide you with exceptional heart care, we have created designated Provider Care Teams.  These Care Teams include your primary Cardiologist (physician) and  Advanced Practice Providers (APPs -  Physician Assistants and Nurse Practitioners) who all work together to provide you with the care you need, when you need it.  Your next appointment:   4-6 week(s)  The format for your next appointment:   In Person  Provider:   Fransico Him   Other Instructions You have been referred to see the Hypertension Clinic  Important Information About Sugar

## 2022-05-08 NOTE — Progress Notes (Signed)
Cardiology CONSULT Note    Date:  05/08/2022   ID:  Gregory Gordon, DOB 01-Jul-1961, MRN [id number]  PCP:  Gregory Gravel, MD  Cardiologist:  Gregory Him, MD   Chief Complaint  Patient presents with   New Patient (Initial Visit)    HTN, LE edema    History of Present Illness:  Gregory Gordon is a 61 y.o. male who is being seen today for the evaluation of HTN at the request of Gregory Gordon, Gregory Her, NP.  This is a 61yo male with a hx of HTN, HLD, DM and CVA.  He has been followed by nephrology for CKD stage 3b.  He has been on Metoprolol in the past but no currently and he does not know why it was stopped.  Currently he is on Losartan '100mg'$  daily, Clonidine 0.'2mg'$  1 tab in the am and 2 tabs in the PM.  He tells me that he has been having LE edema for the past 1-2 months.  His BP at home has been running 145-155/95-105bpm. He denies any chest pain or pressure. He does have DOE if he walks for long distance.  He denies any palpitations or syncope.  He has chronic dizziness for the past 10 years after having a CVA.   Past Medical History:  Diagnosis Date   Diabetes mellitus without complication ([place])    High cholesterol    Hyperlipemia    Hypertension    Stroke [facility])     Past Surgical History:  Procedure Laterality Date   neg hx     TEE WITHOUT CARDIOVERSION N/A 12/10/2012   Procedure: TRANSESOPHAGEAL ECHOCARDIOGRAM (TEE);  Surgeon: Gregory Perla, MD;  Location: [facility] ENDOSCOPY;  Service: Cardiovascular;  Laterality: N/A;  Rm 4N24    Current Medications: Current Meds  Medication Sig   aspirin 81 MG chewable tablet Chew 81 mg by mouth daily.   cloNIDine (CATAPRES) 0.2 MG tablet Take 0.2 mg by mouth 2 (two) times daily.   gabapentin (NEURONTIN) 100 MG capsule SMARTSIG:1 Capsule(s) By Mouth Every Evening   glimepiride (AMARYL) 4 MG tablet Take 4 mg by mouth 2 (two) times daily.   losartan-hydrochlorothiazide (HYZAAR) 100-12.5 MG tablet Take 1 tablet by mouth daily.   metFORMIN  (GLUCOPHAGE) 1000 MG tablet Take 1,000 mg by mouth 2 (two) times daily.   pravastatin (PRAVACHOL) 40 MG tablet Take 40 mg by mouth daily.    Allergies:   Patient has no known allergies.   Social History   Socioeconomic History   Marital status: Married    Spouse name: Not on file   Number of children: Not on file   Years of education: Not on file   Highest education level: Not on file  Occupational History   Not on file  Tobacco Use   Smoking status: Every Day    Packs/day: 1.00    Types: Cigarettes   Smokeless tobacco: Never  Substance and Sexual Activity   Alcohol use: No   Drug use: No   Sexual activity: Not on file  Other Topics Concern   Not on file  Social History Narrative   Not on file   Social Determinants of Health   Financial Resource Strain: Not on file  Food Insecurity: Not on file  Transportation Needs: Not on file  Physical Activity: Not on file  Stress: Not on file  Social Connections: Not on file     Family History:  The patient's family history includes Heart attack (age of onset: 81)  in his brother.   ROS:   Please see the history of present illness.    ROS All other systems reviewed and are negative.      No data to display             PHYSICAL EXAM:   VS:  BP (!) 140/80   Pulse 72   Ht '5\' 10"'$  (1.778 m)   Wt 192 lb (87.1 kg)   BMI 27.55 kg/m    GEN: Well nourished, well developed, in no acute distress  HEENT: normal  Neck: no JVD, carotid bruits, or masses Cardiac: RRR; no murmurs, rubs, or gallops,no edema.  Intact distal pulses bilaterally.  Respiratory:  clear to auscultation bilaterally, normal work of breathing GI: soft, nontender, nondistended, + BS MS: no deformity or atrophy  Skin: warm and dry, no rash Neuro:  Alert and Oriented x 3, Strength and sensation are intact Psych: euthymic mood, full affect  Wt Readings from Last 3 Encounters:  05/08/22 192 lb (87.1 kg)  06/14/19 188 lb (85.3 kg)  03/19/18 185 lb (83.9  kg)      Studies/Labs Reviewed:   EKG:  EKG is ordered today and demosntrates NSR with LVH by voltage  Recent Labs: No results found for requested labs within last 365 days.   Lipid Panel    Component Value Date/Time   CHOL 251 (H) 12/08/2012 2340   TRIG 386 (H) 12/08/2012 2340   HDL 30 (L) 12/08/2012 2340   CHOLHDL 8.4 12/08/2012 2340   VLDL 77 (H) 12/08/2012 2340   LDLCALC 144 (H) 12/08/2012 2340    Additional studies/ records that were reviewed today include:  OV notes from PCP    ASSESSMENT:    1. Primary hypertension   2. Stage 3b chronic kidney disease (HCC)   3. Leg edema   4. Excessive daytime sleepiness      PLAN:  In order of problems listed above:  HTN -BP at home has been running 145-155/95-105bpm -continue prescription drug management for now with Losartan '100mg'$  daily -check renal doppler to rule out RAS -check 24 hour UA for catecholamines, dopamine, cortisol, VMA, metanephrines -I will get records from renal to see if he has had a PRA, renin and aldo levels checked -once 24 hour urine has been collected, start Carvedilol 6.'25mg'$  BID  -will have patient followup in HTN clinic in 2 weeks>>Plan is to wean off Clonidine and uptitrate Carvedilol to full dose '25mg'$  BID to try to get BP under control -Will have renal address if they want to continue ARB  2.  CKD stage 3b -followed by nephrology  3.  LE edema -this has been going on for several months -Unclear etiology but suspect multifactorial from chronic venous stasis and possibly related to CKD -Continue to follow a less than 2 g sodium diet -check 2D echo to assess LVF  4.  Excessive daytime sleepiness/Snoring -given his BP elevations I will get an [facility] Sleep Study  Time Spent: 25 minutes total time of encounter, including 15 minutes spent in face-to-face patient care on the date of this encounter. This time includes coordination of care and counseling regarding above mentioned problem  list. Remainder of non-face-to-face time involved reviewing chart documents/testing relevant to the patient encounter and documentation in the medical record. I have independently reviewed documentation from referring provider  Medication Adjustments/Labs and Tests Ordered: Current medicines are reviewed at length with the patient today.  Concerns regarding medicines are outlined above.  Medication changes, Labs  and Tests ordered today are listed in the Patient Instructions below.  There are no Patient Instructions on file for this visit.   Signed, Gregory Him, MD  05/08/2022 3:14 PM    [facility] [place], [place], [place]  [address] Phone: [phone]; Fax: [phone]

## 2022-05-09 NOTE — Progress Notes (Signed)
Patient ID: Gregory Gordon                 DOB: 10-22-1960                      MRN: [id number]     HPI: Gregory Gordon is a 61 y.o. male referred by Dr. Radford Pax to HTN clinic. PMH is significant for HTN, T2DM with proteinuria on metformin and glimepiride, CKD3b, nephrolithiasis, chronic diastolic CHF, and ischemic stroke 12/08/2012. Pt had AKI secondary to ATN back in July 2023, and voltaren was stopped. Pt seen by Dr. Radford Pax on 05/08/22, pt was unsure why metoprolol was stopped. Pt reported LE edema for past 1-2 months. ECHO was ordered to evaluate this. Home Bps were 145-155/95-105. Office BP was 140/80 and HR 72. Pt also shared he has chronic dizziness since hist stroke. Dr. Radford Pax ordered 24 hours UA to check for catecholamines, dopamine, cortisol, VMA and metanephrines. Also ordered renal doppler to rule out RAS and planned to check renal records to see if PRA, renin and aldosterone levels were checked. Planned to have pt follow up in HTN clinic in 2 weeks. Eventual plan to wean off clonidine and uptitrate carvedilol to 25 mg BID and have renal address if they want to continue arb.   Pt presents to PharmD clinic today with wife. Pt brought in a list of current medications that he is currently taking. He says he is not having any problems with his medications. Informs that he just started carvedilol last night. He was still taking metoprolol at this time as well. He shares that his PCP took him off of hydrochlorothiazide but then put him back on it. He got rid of the HCTZ bottle and shares he was unable to refill it yet since it is too early. Pt shares he is seeing his kidney doctor again on 8/24.  He also experiences dry mouth a lot. He feels sleepy throughout the day time but was not sure if this was due to metoprolol or amlodipine. He also shares that he is getting a sleep study to evaluate for sleep apnea. Pt denies headaches or changes in vision. He has the same level of dizziness that has been  present since his stroke. 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 His swelling improves overnight. He confirms that his kidney doctor took him of of Celebrex. Pt takes first dose of clonidine and carvedilol in the morning and second doses as well as all other blood pressure medications at night. Pt does not add salt to food, does not drink coffee but sometimes drinks sweet tea.   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  Home BP readings:  Takes bp earlier in the morning 092-330/07-622 but systolics up to 633H  Labs: 04/12/22 CMP - Na 145, K 4.7, Scr  2.28, CrCl 35.6 mL/min using Ideal BW  04/12/22 urine protein/creatinine ratio 357 04/12/22 protein electrophoresis 24hr urine - protein 491 mg/24hr  Wt Readings from Last 3 Encounters:  05/08/22 192 lb (87.1 kg)  06/14/19 188 lb (85.3 kg)  03/19/18 185 lb (83.9 kg)   BP Readings from Last 3 Encounters:  05/08/22 (!) 140/80  06/14/19 (!) 156/97  03/19/18 (!) 175/104   Pulse Readings from Last 3 Encounters:  05/08/22 72  06/14/19 87  03/19/18 95    Renal function: CrCl cannot be calculated (Patient's most recent lab result is older than the maximum 21 days allowed.).  Past Medical History:  Diagnosis Date   Diabetes mellitus without complication ([place])    High cholesterol    Hyperlipemia    Hypertension    Stroke [facility])      Current Outpatient Medications on File Prior to Visit  Medication Sig Dispense Refill   aspirin 81 MG chewable tablet Chew 81 mg by mouth daily.     carvedilol (COREG) 3.125 MG tablet Take 1 tablet (3.125 mg total) by mouth 2 (two) times daily. 180 tablet 3   cloNIDine (CATAPRES) 0.2 MG tablet Take 0.2 mg by mouth 2 (two) times daily.     gabapentin (NEURONTIN) 100 MG capsule SMARTSIG:1 Capsule(s) By Mouth Every Evening     glimepiride (AMARYL) 4 MG tablet Take 4 mg by mouth 2 (two) times daily.  5   losartan-hydrochlorothiazide (HYZAAR) 100-12.5 MG tablet Take 1 tablet by mouth daily.  4   metFORMIN (GLUCOPHAGE) 1000 MG tablet Take 1,000 mg by mouth 2 (two) times daily.     pravastatin (PRAVACHOL) 40 MG tablet Take 40 mg by mouth daily.  12   No current facility-administered medications on file prior to visit.    No Known Allergies  There were no vitals taken for this visit.   Assessment/Plan:  1. Hypertension - BP 122/70 is at goal of <130/80 in clinic today. Recent home readings have been above goal. Lower blood pressure today may be due to taking carvedilol while also taking metoprolol. Patient was not resting prior to checking BP at home and was checking prior to medications. Informed patient to stop taking metoprolol, and continue taking carvedilol 3.125 mg twice daily, clonidine 0.2 mg twice daily, amlodipine 5 mg daily, and losartan 100 mg daily. Did not make medication changes today as we would like to see what blood pressure will look like over next few weeks when stopping metoprolol and starting carvedilol. Discussed with patient that before beginning to taper clonidine we would like to get better control of his blood pressure. Reviewed proper technique for taking blood pressure at home. Instructed patient to bring blood pressure cuff and readings into clinic at follow up visit on 9/1.   Thank you,   Eliseo Gum, PharmD PGY1 Pharmacy Resident   05/10/2022  8:42 AM    Ramond Dial, Pharm.D, BCPS, CPP [place]  [address] [place],  [address]  Phone: [phone]; Fax: [phone]

## 2022-05-10 ENCOUNTER — Ambulatory Visit (INDEPENDENT_AMBULATORY_CARE_PROVIDER_SITE_OTHER): Payer: 59 | Admitting: Pharmacist

## 2022-05-10 ENCOUNTER — Ambulatory Visit (HOSPITAL_COMMUNITY)
Admission: RE | Admit: 2022-05-10 | Discharge: 2022-05-10 | Disposition: A | Discharge status: Home / Self Care | Payer: 59 | Source: Ambulatory Visit | Attending: Cardiovascular Disease | Admitting: Cardiovascular Disease

## 2022-05-10 VITALS — BP 122/70 | HR 49

## 2022-05-10 DIAGNOSIS — G4719 Other hypersomnia: Secondary | ICD-10-CM | POA: Insufficient documentation

## 2022-05-10 DIAGNOSIS — R600 Localized edema: Secondary | ICD-10-CM | POA: Insufficient documentation

## 2022-05-10 DIAGNOSIS — I10 Essential (primary) hypertension: Secondary | ICD-10-CM | POA: Insufficient documentation

## 2022-05-10 DIAGNOSIS — N1832 Chronic kidney disease, stage 3b: Secondary | ICD-10-CM | POA: Diagnosis present

## 2022-05-10 NOTE — Patient Instructions (Addendum)
Your blood pressure today is 122/70. Stop taking metoprolol. We will have you continue taking carvedilol 3.125 mg twice daily, clonidine 0.2 mg twice daily, amlodipine 5 mg daily, and losartan 100 mg daily.   We will have you follow up in clinic Friday September 1st at 8 am.   Continue taking your blood pressure at home. Remember to bring blood pressure cuff and bring home readings at next appointment. When taking blood pressure at home remember to keep legs uncrossed, make sure that you are resting for at least 5 minutes and keep your arm rested on the table.   Try to limit drinking sweet tea and try to drink more water.   Your blood pressure goal is <130/80  To check your pressure at home you will need to:  1. Sit up in a chair, with feet flat on the floor and back supported. Do not cross your ankles or legs. 2. Rest your left arm so that the cuff is about heart level. If the cuff goes on your upper arm,  then just relax the arm on the table, arm of the chair or your lap. If you have a wrist cuff, we  suggest relaxing your wrist against your chest (think of it as Pledging the Flag with the  wrong arm).  3. Place the cuff snugly around your arm, about 1 inch above the crook of your elbow. The  cords should be inside the groove of your elbow.  4. Sit quietly, with the cuff in place, for about 5 minutes. After that 5 minutes press the power  button to start a reading. 5. Do not talk or move while the reading is taking place.  6. Record your readings on a sheet of paper. Although most cuffs have a memory, it is often  easier to see a pattern developing when the numbers are all in front of you.  7. You can repeat the reading after 1-3 minutes if it is recommended  Make sure your bladder is empty and you have not had caffeine or tobacco within the last 30 min  Always bring your blood pressure log with you to your appointments. If you have not brought your monitor in to be double checked for  accuracy, please bring it to your next appointment.  You can find a list of validated (accurate) blood pressure cuffs at validatebp.org

## 2022-05-13 ENCOUNTER — Ambulatory Visit (HOSPITAL_COMMUNITY): Payer: 59 | Attending: Cardiology

## 2022-05-13 DIAGNOSIS — R600 Localized edema: Secondary | ICD-10-CM | POA: Insufficient documentation

## 2022-05-13 DIAGNOSIS — N1832 Chronic kidney disease, stage 3b: Secondary | ICD-10-CM | POA: Insufficient documentation

## 2022-05-13 DIAGNOSIS — I10 Essential (primary) hypertension: Secondary | ICD-10-CM | POA: Diagnosis not present

## 2022-05-13 DIAGNOSIS — G4719 Other hypersomnia: Secondary | ICD-10-CM | POA: Diagnosis not present

## 2022-05-13 LAB — ECHOCARDIOGRAM COMPLETE
Area-P 1/2: 3.03 cm2
S' Lateral: 2.8 cm

## 2022-05-24 ENCOUNTER — Ambulatory Visit: Payer: 59

## 2022-05-31 ENCOUNTER — Other Ambulatory Visit: Payer: Self-pay | Admitting: Nephrology

## 2022-05-31 ENCOUNTER — Other Ambulatory Visit (HOSPITAL_COMMUNITY): Payer: Self-pay | Admitting: Nephrology

## 2022-05-31 DIAGNOSIS — N170 Acute kidney failure with tubular necrosis: Secondary | ICD-10-CM

## 2022-05-31 DIAGNOSIS — E1122 Type 2 diabetes mellitus with diabetic chronic kidney disease: Secondary | ICD-10-CM

## 2022-05-31 DIAGNOSIS — E1129 Type 2 diabetes mellitus with other diabetic kidney complication: Secondary | ICD-10-CM

## 2022-05-31 DIAGNOSIS — I5033 Acute on chronic diastolic (congestive) heart failure: Secondary | ICD-10-CM

## 2022-06-13 ENCOUNTER — Other Ambulatory Visit: Payer: Self-pay | Admitting: Radiology

## 2022-06-13 ENCOUNTER — Encounter: Payer: Self-pay | Admitting: Cardiology

## 2022-06-13 ENCOUNTER — Ambulatory Visit: Payer: 59 | Attending: Cardiology | Admitting: Cardiology

## 2022-06-13 ENCOUNTER — Other Ambulatory Visit (HOSPITAL_COMMUNITY): Payer: Self-pay | Admitting: Physician Assistant

## 2022-06-13 VITALS — BP 150/86 | HR 68 | Ht 65.0 in | Wt 192.2 lb

## 2022-06-13 DIAGNOSIS — N1832 Chronic kidney disease, stage 3b: Secondary | ICD-10-CM

## 2022-06-13 DIAGNOSIS — R600 Localized edema: Secondary | ICD-10-CM

## 2022-06-13 DIAGNOSIS — G4719 Other hypersomnia: Secondary | ICD-10-CM | POA: Diagnosis not present

## 2022-06-13 DIAGNOSIS — N179 Acute kidney failure, unspecified: Secondary | ICD-10-CM

## 2022-06-13 DIAGNOSIS — I10 Essential (primary) hypertension: Secondary | ICD-10-CM | POA: Diagnosis not present

## 2022-06-13 NOTE — H&P (Signed)
Chief Complaint: Patient was seen in consultation today for acute kidney failure  Referring Physician(s): [place]  Supervising Physician: Ruthann Cancer  Patient Status: [facility] - In-pt  History of Present Illness: Gregory Gordon is a 61 y.o. male with past medical history of DM, HLD, HTN, CVA who presents with acute kidney failure with tubular necrosis. IR consulted for random renal biopsy at the request of Dr. Theador Hawthorne.  Gregory Gordon presents to [facility] today in his usual state of health. He has been NPO.  He does not take blood thinners. Denies new complaints or concerns. His wife is available for transportation and care today.  He did not take his home medications.   Past Medical History:  Diagnosis Date   Diabetes mellitus without complication ([place])    High cholesterol    Hyperlipemia    Hypertension    Stroke [facility])     Past Surgical History:  Procedure Laterality Date   neg hx     TEE WITHOUT CARDIOVERSION N/A 12/10/2012   Procedure: TRANSESOPHAGEAL ECHOCARDIOGRAM (TEE);  Surgeon: [name], MD;  Location: [facility] ENDOSCOPY;  Service: Cardiovascular;  Laterality: N/A;  Rm 4N24    Allergies: Patient has no known allergies.  Medications: Prior to Admission medications   Medication Sig Start Date End Date Taking? Authorizing Provider  amLODipine (NORVASC) 5 MG tablet Take 5 mg by mouth every evening.   Yes [provider]  carvedilol (COREG) 3.125 MG tablet Take 1 tablet (3.125 mg total) by mouth 2 (two) times daily. 05/08/22  Yes Turner, Eber Hong, MD  chlorthalidone (HYGROTON) 25 MG tablet Take 25 mg by mouth every other day. 05/16/22 05/16/23 Yes [provider]  cloNIDine (CATAPRES) 0.2 MG tablet Take 0.2 mg by mouth 2 (two) times daily. 02/19/22  Yes [provider]  gabapentin (NEURONTIN) 100 MG capsule Take 100 mg by mouth every evening. 04/15/22  Yes [provider]  glimepiride (AMARYL) 4 MG tablet Take 4 mg by mouth  2 (two) times daily. 12/16/17  Yes [provider]  losartan (COZAAR) 100 MG tablet Take 100 mg by mouth daily.   Yes [provider]  metFORMIN (GLUCOPHAGE) 1000 MG tablet Take 1,000 mg by mouth 2 (two) times daily. 04/07/22  Yes [provider]  pravastatin (PRAVACHOL) 80 MG tablet Take 80 mg by mouth at bedtime.   Yes [provider]  aspirin 81 MG chewable tablet Chew 81 mg by mouth daily.    [provider]     Family History  Problem Relation Age of Onset   Heart attack Brother 73       Younger brother just had 2nd MI    Social History   Socioeconomic History   Marital status: Married    Spouse name: Not on file   Number of children: Not on file   Years of education: Not on file   Highest education level: Not on file  Occupational History   Not on file  Tobacco Use   Smoking status: Every Day    Packs/day: 1.00    Types: Cigarettes   Smokeless tobacco: Never  Substance and Sexual Activity   Alcohol use: No   Drug use: No   Sexual activity: Not on file  Other Topics Concern   Not on file  Social History Narrative   Not on file   Social Determinants of Health   Financial Resource Strain: Not on file  Food Insecurity: Not on file  Transportation Needs:  Not on file  Physical Activity: Not on file  Stress: Not on file  Social Connections: Not on file     Review of Systems: A 12 point ROS discussed and pertinent positives are indicated in the HPI above.  All other systems are negative.  Review of Systems  Constitutional:  Negative for fatigue and fever.  Respiratory:  Negative for cough and shortness of breath.   Cardiovascular:  Negative for chest pain.  Gastrointestinal:  Negative for abdominal pain.  Musculoskeletal:  Negative for back pain.  Psychiatric/Behavioral:  Negative for behavioral problems and confusion.     Vital Signs: BP (!) 186/87 Comment: RN Notified  Pulse 65   Temp (!) 97.5 F (36.4 C)  (Temporal)   Resp 15   Ht '5\' 5"'$  (1.651 m)   Wt 192 lb (87.1 kg)   SpO2 97%   BMI 31.95 kg/m   Physical Exam Vitals and nursing note reviewed.  Constitutional:      Appearance: Normal appearance. He is not ill-appearing.  HENT:     Mouth/Throat:     Mouth: Mucous membranes are moist.     Pharynx: Oropharynx is clear.  Cardiovascular:     Rate and Rhythm: Normal rate and regular rhythm.  Pulmonary:     Effort: Pulmonary effort is normal.     Breath sounds: Normal breath sounds.  Abdominal:     General: Abdomen is flat.     Palpations: Abdomen is soft.  Skin:    General: Skin is warm and dry.  Neurological:     General: No focal deficit present.     Mental Status: He is alert and oriented to person, place, and time. Mental status is at baseline.  Psychiatric:        Mood and Affect: Mood normal.        Behavior: Behavior normal.        Thought Content: Thought content normal.        Judgment: Judgment normal.      MD Evaluation Airway: WNL Heart: WNL Abdomen: WNL Chest/ Lungs: WNL ASA  Classification: 3 Mallampati/Airway Score: Two   Imaging: No results found.  Labs:  CBC: Recent Labs    06/14/22 0628  WBC 8.0  HGB 11.4*  HCT 33.4*  PLT 239    COAGS: Recent Labs    06/14/22 0628  INR 1.0    BMP: No results for input(s): "NA", "K", "CL", "CO2", "GLUCOSE", "BUN", "CALCIUM", "CREATININE", "GFRNONAA", "GFRAA" in the last 8760 hours.  Invalid input(s): "CMP"  LIVER FUNCTION TESTS: No results for input(s): "BILITOT", "AST", "ALT", "ALKPHOS", "PROT", "ALBUMIN" in the last 8760 hours.  TUMOR MARKERS: No results for input(s): "AFPTM", "CEA", "CA199", "CHROMGRNA" in the last 8760 hours.  Assessment and Plan: Patient with past medical history of DM, HTN, HLD presents with complaint of working renal failure with tubular necrosis.  IR consulted for random renal biopsy at the request of Dr. Theador Hawthorne. Case reviewed by Dr. Serafina Royals who approves patient for  procedure.  Patient presents today in their usual state of health.  He has been NPO and is not currently on blood thinners.  His BP is elveated this AM at 187/86.  Give '10mg'$  hydralazine, 0.'2mg'$  clonidine.  Monitor for safe window to proceed.   Risks and benefits of biopsy was discussed with the patient and/or patient's family including, but not limited to bleeding, infection, damage to adjacent structures or low yield requiring additional tests.  All of the questions were answered and there  is agreement to proceed.  Consent signed and in chart.   Thank you for this interesting consult.  I greatly enjoyed meeting Gregory Gordon and look forward to participating in their care.  A copy of this report was sent to the requesting provider on this date.  Electronically Signed: Docia Barrier, PA 06/14/2022, 7:37 AM   I spent a total of  30 Minutes   in face to face in clinical consultation, greater than 50% of which was counseling/coordinating care for acute kidney failure.

## 2022-06-13 NOTE — Progress Notes (Signed)
Cardiology CONSULT Note    Date:  06/13/2022   ID:  Gregory Gordon, DOB [birth year], MRN [id number]  PCP:  Jani Gravel, MD  Cardiologist:  Fransico Him, MD   Chief Complaint  Patient presents with   Hypertension    History of Present Illness:  Gregory Gordon is a 61 y.o. male  with a hx of HTN, HLD, DM and CVA.  He has been followed by nephrology for CKD stage 3b.  He has been followed in our hypertension clinic With Pharm.D. with our Pharm.D. and is currently on amlodipine 5 mg daily, carvedilol 3.125 mg twice daily, chlorthalidone 25 mg daily, plan to been 0.2 mg twice daily, losartan 100 mg daily.  At his last office visit I was concerned possibly about sleep apnea as he was complaining of excessive daytime sleepiness and snoring.  I ordered a sleep study but this has not been done.  2D echo done 05/13/2022 showed EF 55 to 60% with mild MR and aortic valve sclerosis with no stenosis.  Renal Dopplers 05/10/2022 showed 1 to 59% right renal artery stenosis and normal left renal artery.  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  Plasma renin and aldosterone levels were not done because he is currently on ARB.  He was post to have 24-hour urine catecholamines done and then start carvedilol but this was never done.  He was encouraged to follow a less than 2 g sodium diet.  He is followed by nephrology.  He is here today for followup and is doing well.  He denies any chest pain or pressure, SOB, DOE, PND, orthopnea,  dizziness, palpitations or syncope.  He has chronic lower extremity edema felt due to chronic venous insufficiency.  He is compliant with his meds and is tolerating meds with no SE.     Past Medical History:  Diagnosis Date   Diabetes mellitus without complication ([place])    High cholesterol    Hyperlipemia    Hypertension    Stroke [facility])     Past Surgical History:  Procedure Laterality Date   neg hx     TEE WITHOUT CARDIOVERSION N/A  12/10/2012   Procedure: TRANSESOPHAGEAL ECHOCARDIOGRAM (TEE);  Surgeon: [name], MD;  Location: [facility] ENDOSCOPY;  Service: Cardiovascular;  Laterality: N/A;  Rm 4N24    Current Medications: Current Meds  Medication Sig   amLODipine (NORVASC) 5 MG tablet Take 5 mg by mouth every evening.   aspirin 81 MG chewable tablet Chew 81 mg by mouth daily.   carvedilol (COREG) 3.125 MG tablet Take 1 tablet (3.125 mg total) by mouth 2 (two) times daily.   chlorthalidone (HYGROTON) 25 MG tablet Take 25 mg by mouth every other day.   cloNIDine (CATAPRES) 0.2 MG tablet Take 0.2 mg by mouth 2 (two) times daily.   gabapentin (NEURONTIN) 100 MG capsule Take 100 mg by mouth every evening.   glimepiride (AMARYL) 4 MG tablet Take 4 mg by mouth 2 (two) times daily.   losartan (COZAAR) 100 MG tablet Take 100 mg by mouth daily.   metFORMIN (GLUCOPHAGE) 1000 MG tablet Take 1,000 mg by mouth 2 (two) times daily.   pravastatin (PRAVACHOL) 80 MG tablet Take 80 mg by mouth at bedtime.    Allergies:   Patient has no known allergies.   Social History   Socioeconomic History   Marital status: Married    Spouse name: Not on file   Number of children: Not  on file   Years of education: Not on file   Highest education level: Not on file  Occupational History   Not on file  Tobacco Use   Smoking status: Every Day    Packs/day: 1.00    Types: Cigarettes   Smokeless tobacco: Never  Substance and Sexual Activity   Alcohol use: No   Drug use: No   Sexual activity: Not on file  Other Topics Concern   Not on file  Social History Narrative   Not on file   Social Determinants of Health   Financial Resource Strain: Not on file  Food Insecurity: Not on file  Transportation Needs: Not on file  Physical Activity: Not on file  Stress: Not on file  Social Connections: Not on file     Family History:  The patient's family history includes Heart attack (age of onset: 22) in his brother.   ROS:   Please see  the history of present illness.    ROS All other systems reviewed and are negative.      No data to display             PHYSICAL EXAM:   VS:  BP (!) 150/86   Pulse 68   Ht '5\' 5"'$  (1.651 m)   Wt 192 lb 3.2 oz (87.2 kg)   SpO2 95%   BMI 31.98 kg/m    GEN: Well nourished, well developed in no acute distress HEENT: Normal NECK: No JVD; No carotid bruits LYMPHATICS: No lymphadenopathy CARDIAC:RRR, no murmurs, rubs, gallops RESPIRATORY:  Clear to auscultation without rales, wheezing or rhonchi  ABDOMEN: Soft, non-tender, non-distended MUSCULOSKELETAL:  No edema; No deformity  SKIN: Warm and dry NEUROLOGIC:  Alert and oriented x 3 PSYCHIATRIC:  Normal affect  Wt Readings from Last 3 Encounters:  06/13/22 192 lb 3.2 oz (87.2 kg)  05/08/22 192 lb (87.1 kg)  06/14/19 188 lb (85.3 kg)      Studies/Labs Reviewed:   EKG:  EKG is not ordered today   Recent Labs: No results found for requested labs within last 365 days.   Lipid Panel    Component Value Date/Time   CHOL 251 (H) 12/08/2012 2340   TRIG 386 (H) 12/08/2012 2340   HDL 30 (L) 12/08/2012 2340   CHOLHDL 8.4 12/08/2012 2340   VLDL 77 (H) 12/08/2012 2340   LDLCALC 144 (H) 12/08/2012 2340    Additional studies/ records that were reviewed today include:  OV notes from PCP    ASSESSMENT:    1. Primary hypertension   2. Stage 3b chronic kidney disease (HCC)   3. Leg edema   4. Excessive daytime sleepiness      PLAN:  In order of problems listed above:  HTN -BP remains elevated  -Showed 1 to 59% right renal artery stenosis -24 hour UA for catecholamines, dopamine, cortisol, VMA, metanephrines was ordered and to be done before he started carvedilol but this was never done -Renal has him on ARB and we have not been able to get any records as to whether they checked a PRA or renin level -He was to have a sleep study but this never occurred -Increase carvedilol to 6.25 mg twice daily -Continue  chlorthalidone 25 mg every other day, clonidine 0.2 mg twice daily, losartan 100 mg daily and amlodipine 5 mg daily -Going forward I feel like he needs to have his blood pressure monitored by nephrology given his CKD as some of these hypertensive medicines will affect his kidney  function.  He just saw nephrology and they started him on diuretics so I would like them to manage his HTN going forward -I will get an appointment in the next week or 2 with his nephrologist and they can follow-up on his renal artery stenosis as well  2.  CKD stage 3b -followed by nephrology -renal function has gotten worse with SCr 2.3 and nephrology is going to do a kidney bx  3.  LE edema -this has been going on for several months -Unclear etiology but suspect multifactorial from chronic venous stasis and possibly related to CKD -Continue to follow a less than 2 g sodium diet -check 2D echo to assess LVF  4.  Excessive daytime sleepiness/Snoring -Sleep was ordered but never done -We discussed this again and the importance of diagnosing sleep apnea which can affect hypertension>>he told me it is a waste of time so he likely will not proceed with it despite my encouragement  Time Spent: 25 minutes total time of encounter, including 15 minutes spent in face-to-face patient care on the date of this encounter. This time includes coordination of care and counseling regarding above mentioned problem list. Remainder of non-face-to-face time involved reviewing chart documents/testing relevant to the patient encounter and documentation in the medical record. I have independently reviewed documentation from referring provider  Medication Adjustments/Labs and Tests Ordered: Current medicines are reviewed at length with the patient today.  Concerns regarding medicines are outlined above.  Medication changes, Labs and Tests ordered today are listed in the Patient Instructions below.  There are no Patient Instructions on file  for this visit.   Signed, Fransico Him, MD  06/13/2022 1:29 PM    [facility] [place], [place], [place]  [address] Phone: [phone]; Fax: [phone]

## 2022-06-13 NOTE — Patient Instructions (Signed)
Medication Instructions:  Your physician recommends that you continue on your current medications as directed. Please refer to the Current Medication list given to you today.  *If you need a refill on your cardiac medications before your next appointment, please call your pharmacy*  Follow-Up: At [facility], you and your health needs are our priority.  As part of our continuing mission to provide you with exceptional heart care, we have created designated Provider Care Teams.  These Care Teams include your primary Cardiologist (physician) and Advanced Practice Providers (APPs -  Physician Assistants and Nurse Practitioners) who all work together to provide you with the care you need, when you need it.  Follow up with your Nephrologist

## 2022-06-14 ENCOUNTER — Ambulatory Visit (HOSPITAL_COMMUNITY)
Admission: RE | Admit: 2022-06-14 | Discharge: 2022-06-14 | Disposition: A | Discharge status: Home / Self Care | Payer: 59 | Source: Ambulatory Visit | Attending: Nephrology | Admitting: Nephrology

## 2022-06-14 ENCOUNTER — Other Ambulatory Visit: Payer: Self-pay

## 2022-06-14 ENCOUNTER — Encounter (HOSPITAL_COMMUNITY): Payer: Self-pay

## 2022-06-14 DIAGNOSIS — I5033 Acute on chronic diastolic (congestive) heart failure: Secondary | ICD-10-CM | POA: Insufficient documentation

## 2022-06-14 DIAGNOSIS — E1129 Type 2 diabetes mellitus with other diabetic kidney complication: Secondary | ICD-10-CM | POA: Insufficient documentation

## 2022-06-14 DIAGNOSIS — N179 Acute kidney failure, unspecified: Secondary | ICD-10-CM

## 2022-06-14 DIAGNOSIS — E1122 Type 2 diabetes mellitus with diabetic chronic kidney disease: Secondary | ICD-10-CM | POA: Insufficient documentation

## 2022-06-14 DIAGNOSIS — N170 Acute kidney failure with tubular necrosis: Secondary | ICD-10-CM | POA: Insufficient documentation

## 2022-06-14 DIAGNOSIS — R809 Proteinuria, unspecified: Secondary | ICD-10-CM | POA: Diagnosis not present

## 2022-06-14 LAB — CBC
HCT: 33.4 % — ABNORMAL LOW (ref 39.0–52.0)
Hemoglobin: 11.4 g/dL — ABNORMAL LOW (ref 13.0–17.0)
MCH: 29.2 pg (ref 26.0–34.0)
MCHC: 34.1 g/dL (ref 30.0–36.0)
MCV: 85.4 fL (ref 80.0–100.0)
Platelets: 239 10*3/uL (ref 150–400)
RBC: 3.91 MIL/uL — ABNORMAL LOW (ref 4.22–5.81)
RDW: 12.6 % (ref 11.5–15.5)
WBC: 8 10*3/uL (ref 4.0–10.5)
nRBC: 0 % (ref 0.0–0.2)

## 2022-06-14 LAB — GLUCOSE, CAPILLARY: Glucose-Capillary: 181 mg/dL — ABNORMAL HIGH (ref 70–99)

## 2022-06-14 LAB — PROTIME-INR
INR: 1 (ref 0.8–1.2)
Prothrombin Time: 13.2 seconds (ref 11.4–15.2)

## 2022-06-14 MED ORDER — SODIUM CHLORIDE 0.9 % IV SOLN: INTRAVENOUS | Status: DC

## 2022-06-14 MED ORDER — HYDRALAZINE HCL 20 MG/ML IJ SOLN: 10.0000 mg | Freq: Once | INTRAMUSCULAR | Status: AC

## 2022-06-14 MED ORDER — HYDRALAZINE HCL 20 MG/ML IJ SOLN
INTRAMUSCULAR | Status: AC
  Administered 2022-06-14: 10 mg via INTRAVENOUS
  Filled 2022-06-14: qty 1

## 2022-06-14 MED ORDER — CLONIDINE HCL 0.2 MG PO TABS
ORAL_TABLET | ORAL | Status: AC
  Filled 2022-06-14: qty 1

## 2022-06-14 MED ORDER — CLONIDINE HCL 0.2 MG PO TABS
0.2000 mg | ORAL_TABLET | Freq: Once | ORAL | Status: AC
  Administered 2022-06-14: 0.2 mg via ORAL

## 2022-06-18 ENCOUNTER — Ambulatory Visit: Payer: 59

## 2022-06-28 DIAGNOSIS — R809 Proteinuria, unspecified: Secondary | ICD-10-CM | POA: Diagnosis not present

## 2022-06-28 DIAGNOSIS — K551 Chronic vascular disorders of intestine: Secondary | ICD-10-CM | POA: Diagnosis not present

## 2022-06-28 DIAGNOSIS — E1122 Type 2 diabetes mellitus with diabetic chronic kidney disease: Secondary | ICD-10-CM | POA: Diagnosis not present

## 2022-06-28 DIAGNOSIS — E1129 Type 2 diabetes mellitus with other diabetic kidney complication: Secondary | ICD-10-CM | POA: Diagnosis not present

## 2022-06-28 DIAGNOSIS — I5033 Acute on chronic diastolic (congestive) heart failure: Secondary | ICD-10-CM | POA: Diagnosis not present

## 2022-06-28 DIAGNOSIS — E870 Hyperosmolality and hypernatremia: Secondary | ICD-10-CM | POA: Diagnosis not present

## 2022-06-28 DIAGNOSIS — E559 Vitamin D deficiency, unspecified: Secondary | ICD-10-CM | POA: Diagnosis not present

## 2022-06-28 DIAGNOSIS — N170 Acute kidney failure with tubular necrosis: Secondary | ICD-10-CM | POA: Diagnosis not present

## 2022-06-28 DIAGNOSIS — I129 Hypertensive chronic kidney disease with stage 1 through stage 4 chronic kidney disease, or unspecified chronic kidney disease: Secondary | ICD-10-CM | POA: Diagnosis not present

## 2022-06-28 DIAGNOSIS — D631 Anemia in chronic kidney disease: Secondary | ICD-10-CM | POA: Diagnosis not present

## 2022-06-28 DIAGNOSIS — N189 Chronic kidney disease, unspecified: Secondary | ICD-10-CM | POA: Diagnosis not present

## 2022-07-03 DIAGNOSIS — E1122 Type 2 diabetes mellitus with diabetic chronic kidney disease: Secondary | ICD-10-CM | POA: Diagnosis not present

## 2022-07-03 DIAGNOSIS — E1129 Type 2 diabetes mellitus with other diabetic kidney complication: Secondary | ICD-10-CM | POA: Diagnosis not present

## 2022-07-03 DIAGNOSIS — R809 Proteinuria, unspecified: Secondary | ICD-10-CM | POA: Diagnosis not present

## 2022-07-03 DIAGNOSIS — N189 Chronic kidney disease, unspecified: Secondary | ICD-10-CM | POA: Diagnosis not present

## 2022-07-03 DIAGNOSIS — D631 Anemia in chronic kidney disease: Secondary | ICD-10-CM | POA: Diagnosis not present

## 2022-07-03 DIAGNOSIS — I5032 Chronic diastolic (congestive) heart failure: Secondary | ICD-10-CM | POA: Diagnosis not present

## 2022-07-03 DIAGNOSIS — E559 Vitamin D deficiency, unspecified: Secondary | ICD-10-CM | POA: Diagnosis not present

## 2022-07-03 DIAGNOSIS — I160 Hypertensive urgency: Secondary | ICD-10-CM | POA: Diagnosis not present

## 2022-07-17 ENCOUNTER — Encounter: Payer: Self-pay | Admitting: Family Medicine

## 2022-07-17 ENCOUNTER — Ambulatory Visit: Payer: 59 | Admitting: Family Medicine

## 2022-07-17 VITALS — BP 138/84 | HR 75 | Ht 71.0 in | Wt 191.1 lb

## 2022-07-17 DIAGNOSIS — Z720 Tobacco use: Secondary | ICD-10-CM | POA: Diagnosis not present

## 2022-07-17 DIAGNOSIS — G8929 Other chronic pain: Secondary | ICD-10-CM

## 2022-07-17 DIAGNOSIS — I10 Essential (primary) hypertension: Secondary | ICD-10-CM

## 2022-07-17 DIAGNOSIS — Z1159 Encounter for screening for other viral diseases: Secondary | ICD-10-CM

## 2022-07-17 DIAGNOSIS — E038 Other specified hypothyroidism: Secondary | ICD-10-CM

## 2022-07-17 DIAGNOSIS — Z2821 Immunization not carried out because of patient refusal: Secondary | ICD-10-CM | POA: Diagnosis not present

## 2022-07-17 DIAGNOSIS — E7849 Other hyperlipidemia: Secondary | ICD-10-CM | POA: Diagnosis not present

## 2022-07-17 DIAGNOSIS — E785 Hyperlipidemia, unspecified: Secondary | ICD-10-CM | POA: Diagnosis not present

## 2022-07-17 DIAGNOSIS — E559 Vitamin D deficiency, unspecified: Secondary | ICD-10-CM

## 2022-07-17 DIAGNOSIS — M25562 Pain in left knee: Secondary | ICD-10-CM

## 2022-07-17 DIAGNOSIS — E1100 Type 2 diabetes mellitus with hyperosmolarity without nonketotic hyperglycemic-hyperosmolar coma (NKHHC): Secondary | ICD-10-CM

## 2022-07-17 MED ORDER — AMLODIPINE BESYLATE 5 MG PO TABS: 5.0000 mg | ORAL_TABLET | Freq: Every evening | ORAL | 1 refills | Status: DC

## 2022-07-17 MED ORDER — METFORMIN HCL 1000 MG PO TABS: 1000.0000 mg | ORAL_TABLET | Freq: Two times a day (BID) | ORAL | 1 refills | Status: DC

## 2022-07-17 MED ORDER — GABAPENTIN 100 MG PO CAPS: 100.0000 mg | ORAL_CAPSULE | Freq: Every evening | ORAL | 1 refills | Status: DC

## 2022-07-17 MED ORDER — LOSARTAN POTASSIUM 100 MG PO TABS: 100.0000 mg | ORAL_TABLET | Freq: Every day | ORAL | 1 refills | Status: DC

## 2022-07-17 MED ORDER — GLIMEPIRIDE 4 MG PO TABS: 4.0000 mg | ORAL_TABLET | Freq: Two times a day (BID) | ORAL | 2 refills | Status: DC

## 2022-07-17 MED ORDER — PRAVASTATIN SODIUM 80 MG PO TABS: 80.0000 mg | ORAL_TABLET | Freq: Every day | ORAL | 2 refills | Status: DC

## 2022-07-17 MED ORDER — CHLORTHALIDONE 25 MG PO TABS: 25.0000 mg | ORAL_TABLET | ORAL | 11 refills | Status: DC

## 2022-07-17 NOTE — Assessment & Plan Note (Addendum)
Controlled No changes made to treatment regimen today Continue taking amlodipine 5 mg daily, losartan 100 mg daily, clonidine 0.2 mg 3 times daily, carvedilol 3.125 mg daily, and chlorthalidone 25 mg daily Will assess CBC and CMP today along with basic labs BP Readings from Last 3 Encounters:  07/17/22 138/84  06/14/22 (!) 178/90  06/13/22 (!) 150/86

## 2022-07-17 NOTE — Assessment & Plan Note (Signed)
He takes pravastatin 80 mg daily Refill sent to pharmacy Will assess lipid panel today Lab Results  Component Value Date   CHOL 251 (H) 12/08/2012   HDL 30 (L) 12/08/2012   LDLCALC 144 (H) 12/08/2012   TRIG 386 (H) 12/08/2012   CHOLHDL 8.4 12/08/2012

## 2022-07-17 NOTE — Assessment & Plan Note (Signed)
Smokes about 1 pack/day  Asked about quitting: confirms that he currently smokes cigarettes Advise to quit smoking: Educated about QUITTING to reduce the risk of cancer, cardio and cerebrovascular disease. Assess willingness: Unwilling to quit at this time, but is working on cutting back. Assist with counseling and pharmacotherapy: Counseled for 5 minutes and literature provided. Arrange for follow up: follow up in 3 months and continue to offer help.  

## 2022-07-17 NOTE — Assessment & Plan Note (Signed)
He takes metformin at 1000 mg twice twice daily and glimepiride 4 mg twice daily Will assess hemoglobin A1c today Patient is currently asymptomatic Lab Results  Component Value Date   HGBA1C 7.6 (H) 12/09/2012

## 2022-07-17 NOTE — Patient Instructions (Addendum)
I appreciate the opportunity to provide care to you today!    Follow up:  3 months  Labs: please stop by the lab today to get your blood drawn (CBC, CMP, TSH, Lipid profile, HgA1c, Vit D)  Screening: Hep C   Please pick up your refills at the pharmacy    Please continue to a heart-healthy diet and increase your physical activities. Try to exercise for 21mns at least three times a week.      It was a pleasure to see you and I look forward to continuing to work together on your health and well-being. Please do not hesitate to call the office if you need care or have questions about your care.   Have a wonderful day and week. With Gratitude, GAlvira MondayMSN, FNP-BC

## 2022-07-17 NOTE — Progress Notes (Signed)
New Patient Office Visit  Subjective:  Patient ID: Gregory Gordon, male    DOB: 1961-07-11  Age: 61 y.o. MRN: [id number]  CC:  Chief Complaint  Patient presents with   Establish Care    Previously seen by [facility], was referred here from kidney specialist to help monitor his bp.     HPI Gregory Gordon is a 61 y.o. male with past medical history of hypertension, type 2 diabetes, myocardial infarction presents for establishing care.  Hypertension: He takes amlodipine 5 mg daily, losartan 100 mg daily, clonidine 0.2 mg 3 times daily, carvedilol 3.125 mg daily, and chlorthalidone 25 mg daily.  He reports that his cardiology started him on carvedilol 3.125 mg daily to wean him off clonidine 0.2 mg.  He denies headaches, dizziness, and blurred vision.  Tobacco use: He smokes 1 pack of cigarettes daily with no intent of quitting anytime soon.  He reports working at a high-stress job.  Type 2 diabetes: He takes metformin at 1000 mg twice twice daily and glimepiride 4 mg twice daily.  He denies polyuria, polyphagia, and polydipsia.  Hyperlipidemia: He takes pravastatin 80 mg daily.  Denies muscle aches, constipation, and fatigue.    Past Medical History:  Diagnosis Date   Diabetes mellitus without complication ([place])    High cholesterol    Hyperlipemia    Hypertension    Stroke [facility])     Past Surgical History:  Procedure Laterality Date   neg hx     TEE WITHOUT CARDIOVERSION N/A 12/10/2012   Procedure: TRANSESOPHAGEAL ECHOCARDIOGRAM (TEE);  Surgeon: [name], MD;  Location: [facility] ENDOSCOPY;  Service: Cardiovascular;  Laterality: N/A;  Rm 4N24    Family History  Problem Relation Age of Onset   Heart attack Brother 66       Younger brother just had 2nd MI    Social History   Socioeconomic History   Marital status: Married    Spouse name: Not on file   Number of children: Not on file   Years of education: Not on file   Highest education level: Not on file   Occupational History   Not on file  Tobacco Use   Smoking status: Every Day    Packs/day: 1.00    Types: Cigarettes   Smokeless tobacco: Never  Substance and Sexual Activity   Alcohol use: No   Drug use: No   Sexual activity: Not on file  Other Topics Concern   Not on file  Social History Narrative   Not on file   Social Determinants of Health   Financial Resource Strain: Not on file  Food Insecurity: Not on file  Transportation Needs: Not on file  Physical Activity: Not on file  Stress: Not on file  Social Connections: Not on file  Intimate Partner Violence: Not on file    ROS Review of Systems  Constitutional:  Negative for fatigue and fever.  HENT:  Negative for sinus pressure and sinus pain.   Eyes:  Negative for photophobia, redness and visual disturbance.  Respiratory:  Negative for chest tightness and shortness of breath.   Cardiovascular:  Negative for chest pain and palpitations.  Gastrointestinal:  Negative for diarrhea, nausea and vomiting.  Endocrine: Negative for polydipsia, polyphagia and polyuria.  Genitourinary:  Negative for frequency and urgency.  Musculoskeletal:  Negative for myalgias and neck pain.  Neurological:  Negative for dizziness and headaches.  Hematological:  Does not bruise/bleed easily.  Psychiatric/Behavioral:  Negative for self-injury and  suicidal ideas.     Objective:   Today's Vitals: BP 138/84   Pulse 75   Ht _0  (1.803 m)   Wt 191 lb 1.3 oz (86.7 kg)   SpO2 95%   BMI 26.65 kg/m   Physical Exam HENT:     Head: Normocephalic.     Right Ear: External ear normal.     Left Ear: External ear normal.     Nose: No congestion.     Mouth/Throat:     Mouth: Mucous membranes are moist.  Eyes:     Extraocular Movements: Extraocular movements intact.     Pupils: Pupils are equal, round, and reactive to light.  Cardiovascular:     Rate and Rhythm: Normal rate and regular rhythm.     Pulses: Normal pulses.     Heart sounds:  Normal heart sounds.  Pulmonary:     Effort: Pulmonary effort is normal.     Breath sounds: Normal breath sounds.  Abdominal:     Palpations: Abdomen is soft.     Tenderness: There is no right CVA tenderness or left CVA tenderness.  Musculoskeletal:     Cervical back: No rigidity.     Right lower leg: No edema.     Left lower leg: No edema.  Skin:    Findings: No lesion or rash.  Neurological:     Mental Status: He is alert and oriented to person, place, and time.  Psychiatric:        Thought Content: Thought content normal.     Assessment & Plan:   Problem List Items Addressed This Visit       Cardiovascular and Mediastinum   HTN (hypertension) - Primary    Controlled No changes made to treatment regimen today Continue taking amlodipine 5 mg daily, losartan 100 mg daily, clonidine 0.2 mg 3 times daily, carvedilol 3.125 mg daily, and chlorthalidone 25 mg daily Will assess CBC and CMP today along with basic labs BP Readings from Last 3 Encounters:  07/17/22 138/84  06/14/22 (!) 178/90  06/13/22 (!) 150/86          Relevant Medications   pravastatin (PRAVACHOL) 80 MG tablet   losartan (COZAAR) 100 MG tablet   chlorthalidone (HYGROTON) 25 MG tablet   amLODipine (NORVASC) 5 MG tablet   Other Relevant Orders   CBC with Differential/Platelet   CMP14+EGFR     Endocrine   DM2 (diabetes mellitus, type 2) ([place])    He takes metformin at 1000 mg twice twice daily and glimepiride 4 mg twice daily Will assess hemoglobin A1c today Patient is currently asymptomatic Lab Results  Component Value Date   HGBA1C 7.6 (H) 12/09/2012       Relevant Medications   metFORMIN (GLUCOPHAGE) 1000 MG tablet   pravastatin (PRAVACHOL) 80 MG tablet   glimepiride (AMARYL) 4 MG tablet   losartan (COZAAR) 100 MG tablet   Other Relevant Orders   Hemoglobin A1C   Microalbumin / creatinine urine ratio     Other   Dyslipidemia    He takes pravastatin 80 mg daily Refill sent to  pharmacy Will assess lipid panel today Lab Results  Component Value Date   CHOL 251 (H) 12/08/2012   HDL 30 (L) 12/08/2012   LDLCALC 144 (H) 12/08/2012   TRIG 386 (H) 12/08/2012   CHOLHDL 8.4 12/08/2012        Relevant Medications   pravastatin (PRAVACHOL) 80 MG tablet   Tobacco use    Smokes about 1 pack/day  Asked about quitting: confirms that he currently smokes cigarettes Advise to quit smoking: Educated about QUITTING to reduce the risk of cancer, cardio and cerebrovascular disease. Assess willingness: Unwilling to quit at this time, but is working on cutting back. Assist with counseling and pharmacotherapy: Counseled for 5 minutes and literature provided. Arrange for follow up: follow up in 3 months and continue to offer help.      Other Visit Diagnoses     Refused influenza vaccine       Need for hepatitis C screening test       Relevant Orders   Hepatitis C Antibody   Other hyperlipidemia       Relevant Medications   pravastatin (PRAVACHOL) 80 MG tablet   losartan (COZAAR) 100 MG tablet   chlorthalidone (HYGROTON) 25 MG tablet   amLODipine (NORVASC) 5 MG tablet   Other Relevant Orders   Lipid Profile   Vitamin D deficiency       Relevant Orders   Vitamin D (25 hydroxy)   Other specified hypothyroidism       Relevant Orders   TSH + free T4   Chronic pain of left knee       Relevant Medications   gabapentin (NEURONTIN) 100 MG capsule       Outpatient Encounter Medications as of 07/17/2022  Medication Sig   aspirin 81 MG chewable tablet Chew 81 mg by mouth daily.   carvedilol (COREG) 3.125 MG tablet Take 1 tablet (3.125 mg total) by mouth 2 (two) times daily.   cloNIDine (CATAPRES) 0.2 MG tablet Take 0.2 mg by mouth 2 (two) times daily.   Omega-3 Fatty Acids (FISH OIL) 1000 MG CAPS Take by mouth. 2 a day   [DISCONTINUED] amLODipine (NORVASC) 5 MG tablet Take 5 mg by mouth every evening.   [DISCONTINUED] chlorthalidone (HYGROTON) 25 MG tablet Take 25 mg  by mouth every other day.   [DISCONTINUED] gabapentin (NEURONTIN) 100 MG capsule Take 100 mg by mouth every evening.   [DISCONTINUED] glimepiride (AMARYL) 4 MG tablet Take 4 mg by mouth 2 (two) times daily.   [DISCONTINUED] losartan (COZAAR) 100 MG tablet Take 100 mg by mouth daily.   [DISCONTINUED] metFORMIN (GLUCOPHAGE) 1000 MG tablet Take 1,000 mg by mouth 2 (two) times daily.   [DISCONTINUED] pravastatin (PRAVACHOL) 80 MG tablet Take 80 mg by mouth at bedtime.   amLODipine (NORVASC) 5 MG tablet Take 1 tablet (5 mg total) by mouth every evening.   chlorthalidone (HYGROTON) 25 MG tablet Take 1 tablet (25 mg total) by mouth every other day.   gabapentin (NEURONTIN) 100 MG capsule Take 1 capsule (100 mg total) by mouth every evening.   glimepiride (AMARYL) 4 MG tablet Take 1 tablet (4 mg total) by mouth 2 (two) times daily.   losartan (COZAAR) 100 MG tablet Take 1 tablet (100 mg total) by mouth daily.   metFORMIN (GLUCOPHAGE) 1000 MG tablet Take 1 tablet (1,000 mg total) by mouth 2 (two) times daily.   pravastatin (PRAVACHOL) 80 MG tablet Take 1 tablet (80 mg total) by mouth at bedtime.   No facility-administered encounter medications on file as of 07/17/2022.    Follow-up: Return in about 3 months (around 10/17/2022).   Alvira Monday, FNP

## 2022-07-31 DIAGNOSIS — E7849 Other hyperlipidemia: Secondary | ICD-10-CM | POA: Diagnosis not present

## 2022-07-31 DIAGNOSIS — E559 Vitamin D deficiency, unspecified: Secondary | ICD-10-CM | POA: Diagnosis not present

## 2022-07-31 DIAGNOSIS — E038 Other specified hypothyroidism: Secondary | ICD-10-CM | POA: Diagnosis not present

## 2022-07-31 DIAGNOSIS — I10 Essential (primary) hypertension: Secondary | ICD-10-CM | POA: Diagnosis not present

## 2022-07-31 DIAGNOSIS — E1100 Type 2 diabetes mellitus with hyperosmolarity without nonketotic hyperglycemic-hyperosmolar coma (NKHHC): Secondary | ICD-10-CM | POA: Diagnosis not present

## 2022-07-31 DIAGNOSIS — Z1159 Encounter for screening for other viral diseases: Secondary | ICD-10-CM | POA: Diagnosis not present

## 2022-08-01 LAB — VITAMIN D 25 HYDROXY (VIT D DEFICIENCY, FRACTURES): Vit D, 25-Hydroxy: 29.6 ng/mL — ABNORMAL LOW (ref 30.0–100.0)

## 2022-08-01 LAB — CMP14+EGFR
ALT: 10 IU/L (ref 0–44)
AST: 11 IU/L (ref 0–40)
Albumin/Globulin Ratio: 2.1 (ref 1.2–2.2)
Albumin: 4.5 g/dL (ref 3.8–4.9)
Alkaline Phosphatase: 67 IU/L (ref 44–121)
BUN/Creatinine Ratio: 11 (ref 10–24)
BUN: 44 mg/dL — ABNORMAL HIGH (ref 8–27)
Bilirubin Total: 0.4 mg/dL (ref 0.0–1.2)
CO2: 21 mmol/L (ref 20–29)
Calcium: 9.6 mg/dL (ref 8.6–10.2)
Chloride: 108 mmol/L — ABNORMAL HIGH (ref 96–106)
Creatinine, Ser: 3.91 mg/dL — ABNORMAL HIGH (ref 0.76–1.27)
Globulin, Total: 2.1 g/dL (ref 1.5–4.5)
Glucose: 91 mg/dL (ref 70–99)
Potassium: 4.1 mmol/L (ref 3.5–5.2)
Sodium: 146 mmol/L — ABNORMAL HIGH (ref 134–144)
Total Protein: 6.6 g/dL (ref 6.0–8.5)
eGFR: 17 mL/min/{1.73_m2} — ABNORMAL LOW (ref >59)

## 2022-08-01 LAB — CBC WITH DIFFERENTIAL/PLATELET
Basophils Absolute: 0 10*3/uL (ref 0.0–0.2)
Basos: 1 %
EOS (ABSOLUTE): 0.2 10*3/uL (ref 0.0–0.4)
Eos: 4 %
Hematocrit: 30.8 % — ABNORMAL LOW (ref 37.5–51.0)
Hemoglobin: 10.2 g/dL — ABNORMAL LOW (ref 13.0–17.7)
Immature Grans (Abs): 0 10*3/uL (ref 0.0–0.1)
Immature Granulocytes: 0 %
Lymphocytes Absolute: 1.4 10*3/uL (ref 0.7–3.1)
Lymphs: 21 %
MCH: 28.3 pg (ref 26.6–33.0)
MCHC: 33.1 g/dL (ref 31.5–35.7)
MCV: 85 fL (ref 79–97)
Monocytes Absolute: 0.4 10*3/uL (ref 0.1–0.9)
Monocytes: 7 %
Neutrophils Absolute: 4.5 10*3/uL (ref 1.4–7.0)
Neutrophils: 67 %
Platelets: 229 10*3/uL (ref 150–450)
RBC: 3.61 x10E6/uL — ABNORMAL LOW (ref 4.14–5.80)
RDW: 13.2 % (ref 11.6–15.4)
WBC: 6.6 10*3/uL (ref 3.4–10.8)

## 2022-08-01 LAB — TSH+FREE T4
Free T4: 1.25 ng/dL (ref 0.82–1.77)
TSH: 2.57 u[IU]/mL (ref 0.450–4.500)

## 2022-08-01 LAB — LIPID PANEL
Chol/HDL Ratio: 4.4 ratio (ref 0.0–5.0)
Cholesterol, Total: 114 mg/dL (ref 100–199)
HDL: 26 mg/dL — ABNORMAL LOW (ref >39)
LDL Chol Calc (NIH): 61 mg/dL (ref 0–99)
Triglycerides: 156 mg/dL — ABNORMAL HIGH (ref 0–149)
VLDL Cholesterol Cal: 27 mg/dL (ref 5–40)

## 2022-08-01 LAB — HEPATITIS C ANTIBODY: Hep C Virus Ab: NONREACTIVE

## 2022-08-01 LAB — HEMOGLOBIN A1C
Est. average glucose Bld gHb Est-mCnc: 146 mg/dL
Hgb A1c MFr Bld: 6.7 % — ABNORMAL HIGH (ref 4.8–5.6)

## 2022-08-01 NOTE — Progress Notes (Signed)
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  His hemoglobin hematocrit levels are decreased with elevated potassium and sodium levels, possibly due to his poor kidney function.  His hemoglobin A1c has reduced from 7.6 to 6.7.  I recommend that he continue oral medication for his diabetes.  His triglyceride level is slightly elevated; I recommend low carbs and fat diet.

## 2022-08-02 NOTE — Progress Notes (Signed)
Okay, I will give him a call today

## 2022-08-07 ENCOUNTER — Other Ambulatory Visit: Payer: Self-pay | Admitting: Family Medicine

## 2022-08-07 ENCOUNTER — Telehealth: Payer: Self-pay | Admitting: Family Medicine

## 2022-08-07 DIAGNOSIS — E1100 Type 2 diabetes mellitus with hyperosmolarity without nonketotic hyperglycemic-hyperosmolar coma (NKHHC): Secondary | ICD-10-CM

## 2022-08-07 MED ORDER — GLIPIZIDE ER 5 MG PO TB24: 5.0000 mg | ORAL_TABLET | Freq: Two times a day (BID) | ORAL | 2 refills | Status: DC

## 2022-08-07 NOTE — Telephone Encounter (Signed)
I called and spoke with the patient's wife and informed her that a prescription for glipizide 5 mg twice daily had been sent to the pharmacy for the patient to start taking for his type 2 diabetes.  The patient informed me that his pravastatin 80 mg and chlorthalidone 25 mg tablet were discontinued from his lab work on 08/16/2022 by the nephrologist.

## 2022-08-07 NOTE — Progress Notes (Signed)
I called and spoke with the patient's wife and informed her that a prescription for glipizide 5 mg twice daily had been sent to the pharmacy for the patient to start taking for his type 2 diabetes.  The patient informed me that his pravastatin 80 mg and chlorthalidone 25 mg tablet were discontinued from his lab work on 08/16/2022 by the nephrologist.

## 2022-08-13 DIAGNOSIS — I5032 Chronic diastolic (congestive) heart failure: Secondary | ICD-10-CM | POA: Diagnosis not present

## 2022-08-13 DIAGNOSIS — R809 Proteinuria, unspecified: Secondary | ICD-10-CM | POA: Diagnosis not present

## 2022-08-13 DIAGNOSIS — E1129 Type 2 diabetes mellitus with other diabetic kidney complication: Secondary | ICD-10-CM | POA: Diagnosis not present

## 2022-08-13 DIAGNOSIS — E1122 Type 2 diabetes mellitus with diabetic chronic kidney disease: Secondary | ICD-10-CM | POA: Diagnosis not present

## 2022-08-13 DIAGNOSIS — I160 Hypertensive urgency: Secondary | ICD-10-CM | POA: Diagnosis not present

## 2022-08-13 DIAGNOSIS — N189 Chronic kidney disease, unspecified: Secondary | ICD-10-CM | POA: Diagnosis not present

## 2022-08-24 DIAGNOSIS — D631 Anemia in chronic kidney disease: Secondary | ICD-10-CM | POA: Diagnosis not present

## 2022-08-24 DIAGNOSIS — I5032 Chronic diastolic (congestive) heart failure: Secondary | ICD-10-CM | POA: Diagnosis not present

## 2022-08-24 DIAGNOSIS — E1122 Type 2 diabetes mellitus with diabetic chronic kidney disease: Secondary | ICD-10-CM | POA: Diagnosis not present

## 2022-08-24 DIAGNOSIS — E1129 Type 2 diabetes mellitus with other diabetic kidney complication: Secondary | ICD-10-CM | POA: Diagnosis not present

## 2022-08-24 DIAGNOSIS — N189 Chronic kidney disease, unspecified: Secondary | ICD-10-CM | POA: Diagnosis not present

## 2022-08-24 DIAGNOSIS — E559 Vitamin D deficiency, unspecified: Secondary | ICD-10-CM | POA: Diagnosis not present

## 2022-08-24 DIAGNOSIS — N170 Acute kidney failure with tubular necrosis: Secondary | ICD-10-CM | POA: Diagnosis not present

## 2022-08-24 DIAGNOSIS — R809 Proteinuria, unspecified: Secondary | ICD-10-CM | POA: Diagnosis not present

## 2022-09-04 ENCOUNTER — Other Ambulatory Visit (HOSPITAL_COMMUNITY): Payer: Self-pay | Admitting: Nephrology

## 2022-09-04 DIAGNOSIS — E1122 Type 2 diabetes mellitus with diabetic chronic kidney disease: Secondary | ICD-10-CM

## 2022-09-04 DIAGNOSIS — I5033 Acute on chronic diastolic (congestive) heart failure: Secondary | ICD-10-CM

## 2022-09-04 DIAGNOSIS — E1129 Type 2 diabetes mellitus with other diabetic kidney complication: Secondary | ICD-10-CM

## 2022-09-04 DIAGNOSIS — N170 Acute kidney failure with tubular necrosis: Secondary | ICD-10-CM

## 2022-09-11 ENCOUNTER — Other Ambulatory Visit: Payer: Self-pay | Admitting: Radiology

## 2022-09-11 DIAGNOSIS — Z01812 Encounter for preprocedural laboratory examination: Secondary | ICD-10-CM

## 2022-09-12 ENCOUNTER — Other Ambulatory Visit: Payer: Self-pay | Admitting: Student

## 2022-09-12 NOTE — H&P (Signed)
Chief Complaint: Patient was seen in consultation today for non-focal renal biopsy.   Referring Physician(s): Walsh S  Supervising Physician: Aletta Edouard  Patient Status: [facility] - Out-pt  History of Present Illness: Gregory Gordon is a 61 y.o. male with a medical history significant for DM, HTN and stroke. He has been followed by Nephrology for chronic kidney disease and recent lab work shows rising creatinine levels. Autoimmune and hepatitis work up has been negative. He was referred to IR earlier this year for a non-focal renal biopsy and he presented 06/14/22 for this procedure. His blood pressure was unfortunately too elevated for the procedure and it was rescheduled. The patient presents today for re-evaluation for a non-focal renal biopsy. His blood pressure is within a normal range.    Past Medical History:  Diagnosis Date   Diabetes mellitus without complication ([place])    High cholesterol    Hyperlipemia    Hypertension    Stroke [facility])     Past Surgical History:  Procedure Laterality Date   neg hx     TEE WITHOUT CARDIOVERSION N/A 12/10/2012   Procedure: TRANSESOPHAGEAL ECHOCARDIOGRAM (TEE);  Surgeon: [name], MD;  Location: [facility] ENDOSCOPY;  Service: Cardiovascular;  Laterality: N/A;  Rm 4N24    Allergies: Patient has no known allergies.  Medications: Prior to Admission medications   Medication Sig Start Date End Date Taking? Authorizing Provider  carvedilol (COREG) 3.125 MG tablet Take 1 tablet (3.125 mg total) by mouth 2 (two) times daily. 05/08/22  Yes Turner, Eber Hong, MD  chlorthalidone (HYGROTON) 25 MG tablet Take 1 tablet (25 mg total) by mouth every other day. Patient taking differently: Take 25 mg by mouth daily. 07/17/22 07/17/23 Yes Alvira Monday, FNP  cloNIDine (CATAPRES) 0.2 MG tablet Take 0.2 mg by mouth 3 (three) times daily. 02/19/22  Yes [provider]  gabapentin (NEURONTIN) 100 MG capsule Take 1 capsule (100 mg total)  by mouth every evening. 07/17/22  Yes Alvira Monday, FNP  glipiZIDE (GLUCOTROL XL) 5 MG 24 hr tablet Take 1 tablet (5 mg total) by mouth in the morning and at bedtime. 08/07/22  Yes Alvira Monday, FNP  NIFEdipine (ADALAT CC) 60 MG 24 hr tablet Take 60 mg by mouth daily.   Yes [provider]  Omega-3 Fatty Acids (FISH OIL) 1000 MG CAPS Take 1,000 mg by mouth in the morning and at bedtime.   Yes [provider]  pravastatin (PRAVACHOL) 80 MG tablet Take 1 tablet (80 mg total) by mouth at bedtime. 07/17/22  Yes Alvira Monday, FNP  amLODipine (NORVASC) 5 MG tablet Take 1 tablet (5 mg total) by mouth every evening. Patient not taking: Reported on 09/12/2022 07/17/22   Alvira Monday, FNP  aspirin 81 MG chewable tablet Chew 81 mg by mouth daily.    [provider]  losartan (COZAAR) 100 MG tablet Take 1 tablet (100 mg total) by mouth daily. Patient not taking: Reported on 09/12/2022 07/17/22   Alvira Monday, FNP     Family History  Problem Relation Age of Onset   Heart attack Brother 16       Younger brother just had 2nd MI    Social History   Socioeconomic History   Marital status: Married    Spouse name: Not on file   Number of children: Not on file   Years of education: Not on file   Highest education level: Not on file  Occupational History   Not on file  Tobacco Use  Smoking status: Every Day    Packs/day: 1.00    Types: Cigarettes   Smokeless tobacco: Never  Vaping Use   Vaping Use: Never used  Substance and Sexual Activity   Alcohol use: No   Drug use: No   Sexual activity: Not on file  Other Topics Concern   Not on file  Social History Narrative   Not on file   Social Determinants of Health   Financial Resource Strain: Not on file  Food Insecurity: Not on file  Transportation Needs: Not on file  Physical Activity: Not on file  Stress: Not on file  Social Connections: Not on file    Review of Systems: A 12 point ROS  discussed and pertinent positives are indicated in the HPI above.  All other systems are negative.  Review of Systems  Constitutional:  Negative for appetite change and fatigue.  Respiratory:  Negative for cough and shortness of breath.   Cardiovascular:  Positive for leg swelling. Negative for chest pain.  Gastrointestinal:  Negative for abdominal pain, diarrhea, nausea and vomiting.  Genitourinary:  Negative for flank pain.  Musculoskeletal:  Negative for back pain.  Neurological:  Negative for dizziness and headaches.    Vital Signs: BP 124/78   Pulse (!) 46   Temp 98 F (36.7 C)   Ht '5\' 11"'$  (1.803 m)   Wt 190 lb (86.2 kg)   SpO2 96%   BMI 26.50 kg/m   Physical Exam Constitutional:      General: He is not in acute distress.    Appearance: He is not ill-appearing.  HENT:     Mouth/Throat:     Mouth: Mucous membranes are moist.     Pharynx: Oropharynx is clear.  Cardiovascular:     Rate and Rhythm: Normal rate and regular rhythm.     Pulses: Normal pulses.     Heart sounds: Normal heart sounds.  Pulmonary:     Effort: Pulmonary effort is normal.     Breath sounds: Normal breath sounds.  Abdominal:     General: Bowel sounds are normal.     Palpations: Abdomen is soft.     Tenderness: There is no abdominal tenderness.  Musculoskeletal:     Right lower leg: No edema.     Left lower leg: No edema.  Skin:    General: Skin is warm and dry.  Neurological:     Mental Status: He is alert and oriented to person, place, and time.  Psychiatric:        Mood and Affect: Mood normal.        Behavior: Behavior normal.        Thought Content: Thought content normal.     Imaging: No results found.  Labs:  CBC: Recent Labs    06/14/22 0628 07/31/22 0809  WBC 8.0 6.6  HGB 11.4* 10.2*  HCT 33.4* 30.8*  PLT 239 229    COAGS: Recent Labs    06/14/22 0628  INR 1.0    BMP: Recent Labs    07/31/22 0809  NA 146*  K 4.1  CL 108*  CO2 21  GLUCOSE 91  BUN  44*  CALCIUM 9.6  CREATININE 3.91*    LIVER FUNCTION TESTS: Recent Labs    07/31/22 0809  BILITOT 0.4  AST 11  ALT 10  ALKPHOS 67  PROT 6.6  ALBUMIN 4.5    TUMOR MARKERS: No results for input(s): "AFPTM", "CEA", "CA199", "CHROMGRNA" in the last 8760 hours.  Assessment and  Plan:  Renal failure: Gregory Gordon, 61 year old male, presents today to the [facility] department for an image-guided non-focal renal biopsy.   Risks and benefits of this procedure were discussed with the patient and/or patient's family including, but not limited to bleeding, infection, damage to adjacent structures or low yield requiring additional tests.  All of the questions were answered and there is agreement to proceed. He has been NPO. He has held his daily dose of 81 mg aspirin for 5 days. Labs are pending but will be reviewed prior to the start of the procedure.   Consent signed and in chart.  Thank you for this interesting consult.  I greatly enjoyed meeting ULAS ZUERCHER and look forward to participating in their care.  A copy of this report was sent to the requesting provider on this date.  Electronically Signed: Soyla Dryer, AGACNP-BC [phone] 09/13/2022, 7:11 AM   I spent a total of  30 Minutes   in face to face in clinical consultation, greater than 50% of which was counseling/coordinating care for non-focal renal biposy

## 2022-09-13 ENCOUNTER — Ambulatory Visit (HOSPITAL_COMMUNITY)
Admission: RE | Admit: 2022-09-13 | Discharge: 2022-09-13 | Disposition: A | Discharge status: Home / Self Care | Payer: 59 | Source: Ambulatory Visit | Attending: Nephrology | Admitting: Nephrology

## 2022-09-13 ENCOUNTER — Encounter (HOSPITAL_COMMUNITY): Payer: Self-pay

## 2022-09-13 DIAGNOSIS — I129 Hypertensive chronic kidney disease with stage 1 through stage 4 chronic kidney disease, or unspecified chronic kidney disease: Secondary | ICD-10-CM | POA: Insufficient documentation

## 2022-09-13 DIAGNOSIS — E1122 Type 2 diabetes mellitus with diabetic chronic kidney disease: Secondary | ICD-10-CM | POA: Insufficient documentation

## 2022-09-13 DIAGNOSIS — Z8673 Personal history of transient ischemic attack (TIA), and cerebral infarction without residual deficits: Secondary | ICD-10-CM | POA: Insufficient documentation

## 2022-09-13 DIAGNOSIS — Z01812 Encounter for preprocedural laboratory examination: Secondary | ICD-10-CM

## 2022-09-13 DIAGNOSIS — N189 Chronic kidney disease, unspecified: Secondary | ICD-10-CM | POA: Insufficient documentation

## 2022-09-13 DIAGNOSIS — N170 Acute kidney failure with tubular necrosis: Secondary | ICD-10-CM | POA: Insufficient documentation

## 2022-09-13 LAB — CBC
HCT: 30 % — ABNORMAL LOW (ref 39.0–52.0)
Hemoglobin: 10 g/dL — ABNORMAL LOW (ref 13.0–17.0)
MCH: 28.2 pg (ref 26.0–34.0)
MCHC: 33.3 g/dL (ref 30.0–36.0)
MCV: 84.5 fL (ref 80.0–100.0)
Platelets: 216 10*3/uL (ref 150–400)
RBC: 3.55 MIL/uL — ABNORMAL LOW (ref 4.22–5.81)
RDW: 12.5 % (ref 11.5–15.5)
WBC: 7.1 10*3/uL (ref 4.0–10.5)
nRBC: 0 % (ref 0.0–0.2)

## 2022-09-13 LAB — GLUCOSE, CAPILLARY: Glucose-Capillary: 252 mg/dL — ABNORMAL HIGH (ref 70–99)

## 2022-09-13 LAB — PROTIME-INR
INR: 1.1 (ref 0.8–1.2)
Prothrombin Time: 13.6 seconds (ref 11.4–15.2)

## 2022-09-13 MED ORDER — SODIUM CHLORIDE 0.9 % IV SOLN: INTRAVENOUS | Status: DC

## 2022-09-13 MED ORDER — GELATIN ABSORBABLE 12-7 MM EX MISC
CUTANEOUS | Status: AC
  Filled 2022-09-13: qty 1

## 2022-09-13 MED ORDER — FENTANYL CITRATE (PF) 100 MCG/2ML IJ SOLN
INTRAMUSCULAR | Status: AC | PRN
  Administered 2022-09-13: 50 ug via INTRAVENOUS

## 2022-09-13 MED ORDER — HYDROMORPHONE HCL 1 MG/ML IJ SOLN
0.5000 mg | Freq: Once | INTRAMUSCULAR | Status: AC
  Administered 2022-09-13: 0.5 mg via INTRAVENOUS
  Filled 2022-09-13: qty 1

## 2022-09-13 MED ORDER — MIDAZOLAM HCL 2 MG/2ML IJ SOLN
INTRAMUSCULAR | Status: AC
  Filled 2022-09-13: qty 2

## 2022-09-13 MED ORDER — GELATIN ABSORBABLE 12-7 MM EX MISC: 1.0000 | Freq: Once | CUTANEOUS | Status: DC

## 2022-09-13 MED ORDER — FENTANYL CITRATE (PF) 100 MCG/2ML IJ SOLN
INTRAMUSCULAR | Status: AC
  Filled 2022-09-13: qty 2

## 2022-09-13 MED ORDER — LIDOCAINE HCL (PF) 1 % IJ SOLN
INTRAMUSCULAR | Status: AC
  Filled 2022-09-13: qty 30

## 2022-09-13 MED ORDER — LIDOCAINE HCL (PF) 1 % IJ SOLN: 5.0000 mL | Freq: Once | INTRAMUSCULAR | Status: DC

## 2022-09-13 MED ORDER — SODIUM CHLORIDE 0.9 % IV SOLN
INTRAVENOUS | Status: AC | PRN
  Administered 2022-09-13: 10 mL/h via INTRAVENOUS

## 2022-09-13 MED ORDER — MIDAZOLAM HCL 2 MG/2ML IJ SOLN
INTRAMUSCULAR | Status: AC | PRN
  Administered 2022-09-13: 1 mg via INTRAVENOUS

## 2022-09-13 NOTE — Progress Notes (Signed)
Pt received pain med (Dilaudid) d/t pain to procedure site. States the pain has since decreased after receiving the IV pain med. Will continue to monitor. No drainage noted to dressing.

## 2022-09-13 NOTE — Sedation Documentation (Signed)
Pain is 5/10 now to left flank. Dr Denna Haggard aware. See orders

## 2022-09-13 NOTE — Progress Notes (Signed)
PA notified of pts state. Crackers and ginger ale provided for pt. Pt up to restroom will re-evaluate after. No further interventions told to provide to pt at this time.

## 2022-09-13 NOTE — Progress Notes (Signed)
Pt has been offered food several times and denies wanting anything to eat or drink. Pt denies any acute pain but slight soreness to procedure area. Does not want any further pain medication for that states he would like to go home. He is dry heaving. Roselyn Reef was paged and awaiting further instructions.

## 2022-09-13 NOTE — Procedures (Signed)
Interventional Radiology Procedure Note  Date of Procedure: 09/13/2022  Procedure: [place] random renal biopsy   Findings:  1. [place] random renal biopsy left kidney 18ga x3 passes with Gelfoam slurry   Complications: No immediate complications noted.   Estimated Blood Loss: minimal  Follow-up and Recommendations: 1. Bedrest 4 hours, first 2 hours flat    Albin Felling, MD  Vascular & Interventional Radiology  09/13/2022 9:18 AM

## 2022-09-17 ENCOUNTER — Other Ambulatory Visit: Payer: Self-pay | Admitting: Family Medicine

## 2022-09-24 ENCOUNTER — Other Ambulatory Visit: Payer: Self-pay

## 2022-09-24 ENCOUNTER — Encounter (HOSPITAL_COMMUNITY): Payer: Self-pay | Admitting: Emergency Medicine

## 2022-09-24 ENCOUNTER — Emergency Department (HOSPITAL_COMMUNITY)
Admission: EM | Admit: 2022-09-24 | Discharge: 2022-09-24 | Discharge status: Against Medical Advice | Payer: 59 | Attending: Emergency Medicine | Admitting: Emergency Medicine

## 2022-09-24 DIAGNOSIS — Z1152 Encounter for screening for COVID-19: Secondary | ICD-10-CM | POA: Insufficient documentation

## 2022-09-24 DIAGNOSIS — R059 Cough, unspecified: Secondary | ICD-10-CM | POA: Insufficient documentation

## 2022-09-24 DIAGNOSIS — R112 Nausea with vomiting, unspecified: Secondary | ICD-10-CM | POA: Diagnosis not present

## 2022-09-24 DIAGNOSIS — Z5321 Procedure and treatment not carried out due to patient leaving prior to being seen by health care provider: Secondary | ICD-10-CM | POA: Diagnosis not present

## 2022-09-24 LAB — RESP PANEL BY RT-PCR (RSV, FLU A&B, COVID)  RVPGX2
Influenza A by PCR: NEGATIVE
Influenza B by PCR: NEGATIVE
Resp Syncytial Virus by PCR: NEGATIVE
SARS Coronavirus 2 by RT PCR: NEGATIVE

## 2022-09-24 NOTE — ED Notes (Signed)
Pt reports he did not take his pm or am dose of BP medication, did take it x 1 hr ago

## 2022-09-24 NOTE — ED Triage Notes (Signed)
Pt c/o cough since yesterday with n/v

## 2022-09-25 ENCOUNTER — Encounter: Payer: Self-pay | Admitting: Internal Medicine

## 2022-09-25 ENCOUNTER — Ambulatory Visit (HOSPITAL_COMMUNITY)
Admission: RE | Admit: 2022-09-25 | Discharge: 2022-09-25 | Disposition: A | Discharge status: Home / Self Care | Payer: 59 | Source: Ambulatory Visit | Attending: Internal Medicine | Admitting: Internal Medicine

## 2022-09-25 ENCOUNTER — Other Ambulatory Visit (HOSPITAL_COMMUNITY)
Admission: RE | Admit: 2022-09-25 | Discharge: 2022-09-25 | Disposition: A | Discharge status: Home / Self Care | Payer: 59 | Source: Ambulatory Visit | Attending: Internal Medicine | Admitting: Internal Medicine

## 2022-09-25 ENCOUNTER — Ambulatory Visit (INDEPENDENT_AMBULATORY_CARE_PROVIDER_SITE_OTHER): Payer: 59 | Admitting: Internal Medicine

## 2022-09-25 VITALS — BP 155/84 | HR 85 | Temp 99.1°F | Resp 17 | Ht 71.0 in | Wt 175.0 lb

## 2022-09-25 DIAGNOSIS — J449 Chronic obstructive pulmonary disease, unspecified: Secondary | ICD-10-CM | POA: Insufficient documentation

## 2022-09-25 DIAGNOSIS — R0600 Dyspnea, unspecified: Secondary | ICD-10-CM | POA: Diagnosis not present

## 2022-09-25 DIAGNOSIS — I700 Atherosclerosis of aorta: Secondary | ICD-10-CM | POA: Diagnosis not present

## 2022-09-25 LAB — CBC WITH DIFFERENTIAL/PLATELET
Abs Immature Granulocytes: 0.03 10*3/uL (ref 0.00–0.07)
Basophils Absolute: 0.1 10*3/uL (ref 0.0–0.1)
Basophils Relative: 1 %
Eosinophils Absolute: 0.1 10*3/uL (ref 0.0–0.5)
Eosinophils Relative: 1 %
HCT: 28.8 % — ABNORMAL LOW (ref 39.0–52.0)
Hemoglobin: 9.2 g/dL — ABNORMAL LOW (ref 13.0–17.0)
Immature Granulocytes: 0 %
Lymphocytes Relative: 14 %
Lymphs Abs: 1.8 10*3/uL (ref 0.7–4.0)
MCH: 27.8 pg (ref 26.0–34.0)
MCHC: 31.9 g/dL (ref 30.0–36.0)
MCV: 87 fL (ref 80.0–100.0)
Monocytes Absolute: 0.9 10*3/uL (ref 0.1–1.0)
Monocytes Relative: 8 %
Neutro Abs: 9.3 10*3/uL — ABNORMAL HIGH (ref 1.7–7.7)
Neutrophils Relative %: 76 %
Platelets: 388 10*3/uL (ref 150–400)
RBC: 3.31 MIL/uL — ABNORMAL LOW (ref 4.22–5.81)
RDW: 12.8 % (ref 11.5–15.5)
WBC: 12.2 10*3/uL — ABNORMAL HIGH (ref 4.0–10.5)
nRBC: 0 % (ref 0.0–0.2)

## 2022-09-25 LAB — BASIC METABOLIC PANEL
Anion gap: 10 (ref 5–15)
BUN: 40 mg/dL — ABNORMAL HIGH (ref 6–20)
CO2: 28 mmol/L (ref 22–32)
Calcium: 9.9 mg/dL (ref 8.9–10.3)
Chloride: 104 mmol/L (ref 98–111)
Creatinine, Ser: 2.87 mg/dL — ABNORMAL HIGH (ref 0.61–1.24)
GFR, Estimated: 24 mL/min — ABNORMAL LOW (ref >60)
Glucose, Bld: 231 mg/dL — ABNORMAL HIGH (ref 70–99)
Potassium: 3.9 mmol/L (ref 3.5–5.1)
Sodium: 142 mmol/L (ref 135–145)

## 2022-09-25 MED ORDER — PREDNISONE 20 MG PO TABS: 40.0000 mg | ORAL_TABLET | Freq: Every day | ORAL | 0 refills | Status: AC

## 2022-09-25 MED ORDER — TRELEGY ELLIPTA 200-62.5-25 MCG/ACT IN AEPB: INHALATION_SPRAY | RESPIRATORY_TRACT | 0 refills | Status: DC

## 2022-09-25 MED ORDER — ALBUTEROL SULFATE HFA 108 (90 BASE) MCG/ACT IN AERS: 2.0000 | INHALATION_SPRAY | Freq: Four times a day (QID) | RESPIRATORY_TRACT | 0 refills | Status: AC | PRN

## 2022-09-25 MED ORDER — AZITHROMYCIN 250 MG PO TABS: ORAL_TABLET | ORAL | 0 refills | Status: DC

## 2022-09-25 NOTE — Patient Instructions (Addendum)
Thank you for trusting me with your care. To recap, today we discussed the following:   COPD with exacerbation suggested by initial evaluation   - DG Chest 2 View; Future - CBC with Differential/Platelet - Basic metabolic panel - azithromycin (ZITHROMAX Z-PAK) 250 MG tablet; Take 2 tablets (500 mg) PO today, then 1 tablet (250 mg) PO daily x4 days.  Dispense: 6 tablet; Refill: 0 - predniSONE (DELTASONE) 20 MG tablet; Take 2 tablets (40 mg total) by mouth daily with breakfast for 5 days.  Dispense: 10 tablet; Refill: 0 - albuterol (VENTOLIN HFA) 108 (90 Base) MCG/ACT inhaler; Inhale 2 puffs into the lungs every 6 (six) hours as needed for wheezing or shortness of breath.  Dispense: 8 g; Refill: 0

## 2022-09-25 NOTE — Progress Notes (Signed)
     CC: ER follow up  (Tested negative for flu, covid and RSV yesterday in the ER. No appetite, doesn't want to drink anything, has been coughing a lot of bad tasting mucus x Saturday 12/30. Ended up leaving ER without being fully evaluated )    HPI:[name] is a 62 y.o. male who presents for evaluation of productive cough and malaise. For the details of today's visit, please refer to the assessment and plan.   Physical Exam: Vitals:   09/25/22 1059  BP: (!) 155/84  Pulse: 85  Resp: 17  Temp: 99.1 F (37.3 C)  TempSrc: Oral  SpO2: 92%  Weight: 175 lb (79.4 kg)  Height: '5\' 11"'$  (1.803 m)     Physical Exam Constitutional:      Appearance: He is ill-appearing. He is not toxic-appearing.  Cardiovascular:     Rate and Rhythm: Normal rate and regular rhythm.  Pulmonary:     Breath sounds: Wheezing present. No rhonchi or rales.     Comments: Poor air movement at bases Musculoskeletal:     Right lower leg: No edema.     Left lower leg: No edema.  Skin:    General: Skin is warm and dry.      Assessment & Plan:   COPD suggested by initial evaluation [facility]) Patient presents with a productive cough, shortness of breath, and malaise. He has 45 pack years of tobacco use. He tested negative for Flu, Covid, and RSV. No chest pain or lower extremity edema.   Assessment/Plan: Undiagnosed new problem with uncertain prognosis. Start treatment for COPD with acute exacerbation . Will need PFT's in future.  - DG Chest 2 View; Future - CBC with Differential/Platelet - Basic metabolic panel - azithromycin (ZITHROMAX Z-PAK) 250 MG tablet; Take 2 tablets (500 mg) PO today, then 1 tablet (250 mg) PO daily x4 days.  Dispense: 6 tablet; Refill: 0 - predniSONE (DELTASONE) 20 MG tablet; Take 2 tablets (40 mg total) by mouth daily with breakfast for 5 days.  Dispense: 10 tablet; Refill: 0 - albuterol (VENTOLIN HFA) 108 (90 Base) MCG/ACT inhaler; Inhale 2 puffs into the lungs every 6  (six) hours as needed for wheezing or shortness of breath.  Dispense: 8 g; Refill: 0 - Basic Metabolic Panel (BMET); Future - CBC with Differential/Platelet; Future      Lorene Dy, MD

## 2022-09-26 ENCOUNTER — Encounter: Payer: Self-pay | Admitting: Internal Medicine

## 2022-09-26 ENCOUNTER — Encounter (HOSPITAL_COMMUNITY): Payer: Self-pay

## 2022-09-26 DIAGNOSIS — J449 Chronic obstructive pulmonary disease, unspecified: Secondary | ICD-10-CM | POA: Insufficient documentation

## 2022-09-26 LAB — SURGICAL PATHOLOGY

## 2022-09-26 NOTE — Assessment & Plan Note (Addendum)
Patient presents with a productive cough, shortness of breath, and malaise. He has 45 pack years of tobacco use. He tested negative for Flu, Covid, and RSV. No chest pain or lower extremity edema.   Assessment/Plan: Undiagnosed new problem with uncertain prognosis. Start treatment for COPD with acute exacerbation . Will need PFT's in future.  - DG Chest 2 View; Future - CBC with Differential/Platelet - Basic metabolic panel - azithromycin (ZITHROMAX Z-PAK) 250 MG tablet; Take 2 tablets (500 mg) PO today, then 1 tablet (250 mg) PO daily x4 days.  Dispense: 6 tablet; Refill: 0 - predniSONE (DELTASONE) 20 MG tablet; Take 2 tablets (40 mg total) by mouth daily with breakfast for 5 days.  Dispense: 10 tablet; Refill: 0 - albuterol (VENTOLIN HFA) 108 (90 Base) MCG/ACT inhaler; Inhale 2 puffs into the lungs every 6 (six) hours as needed for wheezing or shortness of breath.  Dispense: 8 g; Refill: 0 - Basic Metabolic Panel (BMET); Future - CBC with Differential/Platelet; Future

## 2022-10-11 DIAGNOSIS — D631 Anemia in chronic kidney disease: Secondary | ICD-10-CM | POA: Diagnosis not present

## 2022-10-11 DIAGNOSIS — E1129 Type 2 diabetes mellitus with other diabetic kidney complication: Secondary | ICD-10-CM | POA: Diagnosis not present

## 2022-10-11 DIAGNOSIS — N170 Acute kidney failure with tubular necrosis: Secondary | ICD-10-CM | POA: Diagnosis not present

## 2022-10-11 DIAGNOSIS — R809 Proteinuria, unspecified: Secondary | ICD-10-CM | POA: Diagnosis not present

## 2022-10-11 DIAGNOSIS — N189 Chronic kidney disease, unspecified: Secondary | ICD-10-CM | POA: Diagnosis not present

## 2022-10-11 DIAGNOSIS — I5032 Chronic diastolic (congestive) heart failure: Secondary | ICD-10-CM | POA: Diagnosis not present

## 2022-10-11 DIAGNOSIS — E559 Vitamin D deficiency, unspecified: Secondary | ICD-10-CM | POA: Diagnosis not present

## 2022-10-11 DIAGNOSIS — E1122 Type 2 diabetes mellitus with diabetic chronic kidney disease: Secondary | ICD-10-CM | POA: Diagnosis not present

## 2022-10-14 DIAGNOSIS — E1122 Type 2 diabetes mellitus with diabetic chronic kidney disease: Secondary | ICD-10-CM | POA: Diagnosis not present

## 2022-10-14 DIAGNOSIS — E559 Vitamin D deficiency, unspecified: Secondary | ICD-10-CM | POA: Diagnosis not present

## 2022-10-14 DIAGNOSIS — N170 Acute kidney failure with tubular necrosis: Secondary | ICD-10-CM | POA: Diagnosis not present

## 2022-10-14 DIAGNOSIS — N189 Chronic kidney disease, unspecified: Secondary | ICD-10-CM | POA: Diagnosis not present

## 2022-10-14 DIAGNOSIS — E1129 Type 2 diabetes mellitus with other diabetic kidney complication: Secondary | ICD-10-CM | POA: Diagnosis not present

## 2022-10-14 DIAGNOSIS — I5032 Chronic diastolic (congestive) heart failure: Secondary | ICD-10-CM | POA: Diagnosis not present

## 2022-10-14 DIAGNOSIS — R809 Proteinuria, unspecified: Secondary | ICD-10-CM | POA: Diagnosis not present

## 2022-10-14 DIAGNOSIS — D631 Anemia in chronic kidney disease: Secondary | ICD-10-CM | POA: Diagnosis not present

## 2022-10-18 ENCOUNTER — Ambulatory Visit: Payer: 59 | Admitting: Internal Medicine

## 2022-10-18 ENCOUNTER — Ambulatory Visit: Payer: 59 | Admitting: Family Medicine

## 2022-11-11 ENCOUNTER — Other Ambulatory Visit: Payer: Self-pay

## 2022-11-11 DIAGNOSIS — I5032 Chronic diastolic (congestive) heart failure: Secondary | ICD-10-CM | POA: Diagnosis not present

## 2022-11-11 DIAGNOSIS — D631 Anemia in chronic kidney disease: Secondary | ICD-10-CM | POA: Diagnosis not present

## 2022-11-11 DIAGNOSIS — N170 Acute kidney failure with tubular necrosis: Secondary | ICD-10-CM | POA: Diagnosis not present

## 2022-11-11 DIAGNOSIS — E1122 Type 2 diabetes mellitus with diabetic chronic kidney disease: Secondary | ICD-10-CM | POA: Diagnosis not present

## 2022-11-11 DIAGNOSIS — E559 Vitamin D deficiency, unspecified: Secondary | ICD-10-CM | POA: Diagnosis not present

## 2022-11-11 DIAGNOSIS — N189 Chronic kidney disease, unspecified: Secondary | ICD-10-CM | POA: Diagnosis not present

## 2022-11-11 DIAGNOSIS — R809 Proteinuria, unspecified: Secondary | ICD-10-CM | POA: Diagnosis not present

## 2022-11-11 DIAGNOSIS — E1129 Type 2 diabetes mellitus with other diabetic kidney complication: Secondary | ICD-10-CM | POA: Diagnosis not present

## 2022-11-15 ENCOUNTER — Ambulatory Visit: Payer: 59 | Admitting: Internal Medicine

## 2022-11-15 ENCOUNTER — Encounter: Payer: Self-pay | Admitting: Internal Medicine

## 2022-11-15 VITALS — BP 168/85 | HR 67 | Ht 66.0 in | Wt 190.4 lb

## 2022-11-15 DIAGNOSIS — I10 Essential (primary) hypertension: Secondary | ICD-10-CM | POA: Diagnosis not present

## 2022-11-15 DIAGNOSIS — E1122 Type 2 diabetes mellitus with diabetic chronic kidney disease: Secondary | ICD-10-CM | POA: Diagnosis not present

## 2022-11-15 DIAGNOSIS — N184 Chronic kidney disease, stage 4 (severe): Secondary | ICD-10-CM | POA: Diagnosis not present

## 2022-11-15 LAB — POCT GLYCOSYLATED HEMOGLOBIN (HGB A1C): HbA1c, POC (controlled diabetic range): 10.2 % — AB (ref 0.0–7.0)

## 2022-11-15 MED ORDER — EMPAGLIFLOZIN 10 MG PO TABS: 10.0000 mg | ORAL_TABLET | Freq: Every day | ORAL | 2 refills | Status: AC

## 2022-11-15 MED ORDER — TIRZEPATIDE 2.5 MG/0.5ML ~~LOC~~ SOAJ: 2.5000 mg | SUBCUTANEOUS | 0 refills | Status: DC

## 2022-11-15 NOTE — Assessment & Plan Note (Signed)
He reports today his antihypertensive regimen has recently been adjusted by nephrology.  He is currently prescribed clonidine 0.2 mg 3 times daily, nifedipine 60 mg daily, carvedilol 3.125 mg twice daily, and chlorthalidone 25 mg daily.  His blood pressure is elevated today, 157/87 initially and 168/85 on repeat.  He reports taking his medications as prescribed.  Of note, he was evaluated by nephrology on 2/21 and it was documented that his systolic blood pressure was less than 120 mmHg. -Defer medication changes for now given normotensive readings earlier this week. -Follow-up in 4 weeks for HTN check

## 2022-11-15 NOTE — Assessment & Plan Note (Signed)
POC A1c today has increased to 10.2 from 6.7 previously.  He is currently prescribed glipizide 5 mg twice daily.  Metformin was recently discontinued by nephrology in the setting of CKD.  He endorses polyuria/polydipsia today. -Start Mounjaro 2.5 mg weekly and will also start Jardiance 10 mg daily. -Follow-up in 4 weeks for reassessment

## 2022-11-15 NOTE — Patient Instructions (Signed)
It was a pleasure to see you today.  Thank you for giving [place] the opportunity to be involved in your care.  Below is a brief recap of your visit and next steps.  We will plan to see you again in 1 month.  Summary Start Jardiance 10 mg daily, Mounjaro 2.5 mg weekly Follow up in 1 month

## 2022-11-15 NOTE — Assessment & Plan Note (Addendum)
CKD 3B/4.  Followed by nephrology (Dr. Theador Hawthorne).  Labs updated earlier this week. -Add Jardiance 10 mg daily in the setting of poorly controlled DM complicated by CKD.  This was mentioned by his nephrologist, however initiation of SGLT2 therapy was deferred in the setting of a systolic blood pressure < 120 mmHg. -Follow-up in 4 weeks for reassessment

## 2022-11-15 NOTE — Progress Notes (Signed)
Established Patient Office Visit  Subjective   Patient ID: Gregory Gordon, male    DOB: 08-03-1961  Age: 62 y.o. MRN: [id number]  Chief Complaint  Patient presents with   Diabetes    Follow up   Mr. Gregory Gordon returns to care today for routine follow-up.  He was last seen at [facility] on 1/3 by Dr. Court Joy for ER follow-up.  In the interim he has been seen by nephrology (Dr. Theador Hawthorne) for follow-up.  There have otherwise been no acute interval events.  Mr. Gregory Gordon acute concern today is diabetes.  He reports that his blood sugar has been elevated ever since metformin was discontinued by his nephrologist.  He endorses polyuria/polydipsia.  His last A1c was 6.7 in October 2023.  He is currently taking glipizide 5 mg twice daily.  Past Medical History:  Diagnosis Date   Diabetes mellitus without complication ([place])    High cholesterol    Hyperlipemia    Hypertension    Stroke [facility])    Past Surgical History:  Procedure Laterality Date   neg hx     TEE WITHOUT CARDIOVERSION N/A 12/10/2012   Procedure: TRANSESOPHAGEAL ECHOCARDIOGRAM (TEE);  Surgeon: [name], MD;  Location: [facility] ENDOSCOPY;  Service: Cardiovascular;  Laterality: N/A;  Rm 4N24   Social History   Tobacco Use   Smoking status: Every Day    Packs/day: 1.00    Years: 45.00    Total pack years: 45.00    Types: Cigarettes   Smokeless tobacco: Never  Vaping Use   Vaping Use: Never used  Substance Use Topics   Alcohol use: No   Drug use: No   Family History  Problem Relation Age of Onset   Heart attack Brother 83       Younger brother just had 2nd MI   No Known Allergies  Review of Systems  Constitutional:  Negative for chills and fever.  HENT:  Negative for sore throat.   Respiratory:  Negative for cough and shortness of breath.   Cardiovascular:  Positive for leg swelling. Negative for chest pain and palpitations.  Gastrointestinal:  Negative for abdominal pain, blood in stool, constipation, diarrhea, nausea and  vomiting.  Genitourinary:  Positive for frequency. Negative for dysuria and hematuria.  Musculoskeletal:  Negative for myalgias.  Skin:  Negative for itching and rash.  Neurological:  Negative for dizziness and headaches.  Endo/Heme/Allergies:  Positive for polydipsia.       Polyuria  Psychiatric/Behavioral:  Negative for depression and suicidal ideas.       Objective:     BP (!) 168/85   Pulse 67   Ht '5\' 6"'$  (1.676 m)   Wt 190 lb 6.4 oz (86.4 kg)   SpO2 95%   BMI 30.73 kg/m  BP Readings from Last 3 Encounters:  11/15/22 (!) 168/85  09/25/22 (!) 155/84  09/24/22 (!) 162/102   Physical Exam Vitals reviewed.  Constitutional:      General: He is not in acute distress.    Appearance: Normal appearance. He is not ill-appearing.  HENT:     Head: Normocephalic and atraumatic.     Right Ear: External ear normal.     Left Ear: External ear normal.     Nose: Nose normal. No congestion or rhinorrhea.     Mouth/Throat:     Mouth: Mucous membranes are moist.     Pharynx: Oropharynx is clear.  Eyes:     General: No scleral icterus.    Extraocular Movements: Extraocular  movements intact.     Conjunctiva/sclera: Conjunctivae normal.     Pupils: Pupils are equal, round, and reactive to light.  Cardiovascular:     Rate and Rhythm: Normal rate and regular rhythm.     Pulses: Normal pulses.     Heart sounds: Normal heart sounds. No murmur heard. Pulmonary:     Effort: Pulmonary effort is normal.     Breath sounds: Normal breath sounds. No wheezing, rhonchi or rales.  Abdominal:     General: Abdomen is flat. Bowel sounds are normal. There is no distension.     Palpations: Abdomen is soft.     Tenderness: There is no abdominal tenderness.  Musculoskeletal:        General: No swelling or deformity. Normal range of motion.     Cervical back: Normal range of motion.     Right lower leg: Edema present.     Left lower leg: Edema present.  Skin:    General: Skin is warm and dry.      Capillary Refill: Capillary refill takes less than 2 seconds.  Neurological:     General: No focal deficit present.     Mental Status: He is alert and oriented to person, place, and time.     Motor: No weakness.  Psychiatric:        Mood and Affect: Mood normal.        Behavior: Behavior normal.        Thought Content: Thought content normal.   Last CBC Lab Results  Component Value Date   WBC 12.2 (H) 09/25/2022   HGB 9.2 (L) 09/25/2022   HCT 28.8 (L) 09/25/2022   MCV 87.0 09/25/2022   MCH 27.8 09/25/2022   RDW 12.8 09/25/2022   PLT 388 [id number]   Last metabolic panel Lab Results  Component Value Date   GLUCOSE 231 (H) 09/25/2022   NA 142 09/25/2022   K 3.9 09/25/2022   CL 104 09/25/2022   CO2 28 09/25/2022   BUN 40 (H) 09/25/2022   CREATININE 2.87 (H) 09/25/2022   GFRNONAA 24 (L) 09/25/2022   CALCIUM 9.9 09/25/2022   PROT 6.6 07/31/2022   ALBUMIN 4.5 07/31/2022   LABGLOB 2.1 07/31/2022   AGRATIO 2.1 07/31/2022   BILITOT 0.4 07/31/2022   ALKPHOS 67 07/31/2022   AST 11 07/31/2022   ALT 10 07/31/2022   ANIONGAP 10 09/25/2022   Last lipids Lab Results  Component Value Date   CHOL 114 07/31/2022   HDL 26 (L) 07/31/2022   LDLCALC 61 07/31/2022   TRIG 156 (H) 07/31/2022   CHOLHDL 4.4 07/31/2022   Last hemoglobin A1c Lab Results  Component Value Date   HGBA1C 10.2 (A) 11/15/2022   Last thyroid functions Lab Results  Component Value Date   TSH 2.570 07/31/2022   Last vitamin D Lab Results  Component Value Date   VD25OH 29.6 (L) 07/31/2022     Assessment & Plan:   Problem List Items Addressed This Visit       HTN (hypertension)    He reports today his antihypertensive regimen has recently been adjusted by nephrology.  He is currently prescribed clonidine 0.2 mg 3 times daily, nifedipine 60 mg daily, carvedilol 3.125 mg twice daily, and chlorthalidone 25 mg daily.  His blood pressure is elevated today, 157/87 initially and 168/85 on repeat.  He  reports taking his medications as prescribed.  Of note, he was evaluated by nephrology on 2/21 and it was documented that his systolic blood pressure  was less than 120 mmHg. -Defer medication changes for now given normotensive readings earlier this week. -Follow-up in 4 weeks for HTN check      DM2 (diabetes mellitus, type 2) ([place]) - Primary    POC A1c today has increased to 10.2 from 6.7 previously.  He is currently prescribed glipizide 5 mg twice daily.  Metformin was recently discontinued by nephrology in the setting of CKD.  He endorses polyuria/polydipsia today. -Start Mounjaro 2.5 mg weekly and will also start Jardiance 10 mg daily. -Follow-up in 4 weeks for reassessment      CKD (chronic kidney disease) stage 4, GFR 15-29 ml/min (HCC)    CKD 3B/4.  Followed by nephrology (Dr. Theador Hawthorne).  Labs updated earlier this week. -Add Jardiance 10 mg daily in the setting of poorly controlled DM complicated by CKD.  This was mentioned by his nephrologist, however initiation of SGLT2 therapy was deferred in the setting of a systolic blood pressure < 120 mmHg. -Follow-up in 4 weeks for reassessment       Return in about 4 weeks (around 12/13/2022) for DM.    Johnette Abraham, MD

## 2022-11-26 ENCOUNTER — Other Ambulatory Visit: Payer: Self-pay

## 2022-11-26 DIAGNOSIS — N184 Chronic kidney disease, stage 4 (severe): Secondary | ICD-10-CM

## 2022-11-26 DIAGNOSIS — E119 Type 2 diabetes mellitus without complications: Secondary | ICD-10-CM

## 2022-11-26 MED ORDER — TRULICITY 0.75 MG/0.5ML ~~LOC~~ SOAJ: 0.7500 mg | SUBCUTANEOUS | 0 refills | Status: DC

## 2022-12-13 ENCOUNTER — Ambulatory Visit: Payer: 59 | Admitting: Internal Medicine

## 2022-12-13 ENCOUNTER — Encounter: Payer: Self-pay | Admitting: Internal Medicine

## 2022-12-13 VITALS — BP 142/82 | HR 63 | Ht 70.0 in | Wt 183.4 lb

## 2022-12-13 DIAGNOSIS — E119 Type 2 diabetes mellitus without complications: Secondary | ICD-10-CM | POA: Diagnosis not present

## 2022-12-13 MED ORDER — TRULICITY 1.5 MG/0.5ML ~~LOC~~ SOAJ: 1.5000 mg | SUBCUTANEOUS | 0 refills | Status: DC

## 2022-12-13 NOTE — Patient Instructions (Signed)
It was a pleasure to see you today.  Thank you for giving [place] the opportunity to be involved in your care.  Below is a brief recap of your visit and next steps.  We will plan to see you again in 10 weeks.  Summary Increase Trulicity to 1.5 mg weekly after you complete 4 injections of 0.75 mg weekly Continue Jardiance and glipizide We will plan for follow up in late May for diabetes follow up

## 2022-12-13 NOTE — Progress Notes (Unsigned)
Established Patient Office Visit  Subjective   Patient ID: Gregory Gordon, male    DOB: Feb 10, 1961  Age: 62 y.o. MRN: [id number]  Chief Complaint  Patient presents with   Diabetes    Follow up   Gregory Gordon returns to care today for DM follow-up.  He was last evaluated by me on 2/23 at which time his A1c had increased to 10.2 from 6.7 previously.  Mounjaro 2.5 mg weekly was initially prescribed as well as Jardiance 10 mg daily.  His insurance cover Trulicity instead of Mounjaro and this switch was made accordingly.  There have otherwise been no acute interval events.  Gregory Gordon reports doing well today.  He states that he has completed one Trulicity injection and has been taking Jardiance as prescribed.  He has no additional concerns to discuss today.  Past Medical History:  Diagnosis Date   Diabetes mellitus without complication ([place])    High cholesterol    Hyperlipemia    Hypertension    Stroke [facility])    Past Surgical History:  Procedure Laterality Date   neg hx     TEE WITHOUT CARDIOVERSION N/A 12/10/2012   Procedure: TRANSESOPHAGEAL ECHOCARDIOGRAM (TEE);  Surgeon: [name], MD;  Location: [facility] ENDOSCOPY;  Service: Cardiovascular;  Laterality: N/A;  Rm 4N24   Social History   Tobacco Use   Smoking status: Every Day    Packs/day: 1.00    Years: 45.00    Additional pack years: 0.00    Total pack years: 45.00    Types: Cigarettes   Smokeless tobacco: Never  Vaping Use   Vaping Use: Never used  Substance Use Topics   Alcohol use: No   Drug use: No   Family History  Problem Relation Age of Onset   Heart attack Brother 11       Younger brother just had 2nd MI   No Known Allergies  Review of Systems  Constitutional:  Negative for chills and fever.  HENT:  Negative for sore throat.   Respiratory:  Negative for cough and shortness of breath.   Cardiovascular:  Negative for chest pain, palpitations and leg swelling.  Gastrointestinal:  Negative for abdominal pain,  blood in stool, constipation, diarrhea, nausea and vomiting.  Genitourinary:  Negative for dysuria and hematuria.  Musculoskeletal:  Negative for myalgias.  Skin:  Negative for itching and rash.  Neurological:  Negative for dizziness and headaches.  Psychiatric/Behavioral:  Negative for depression and suicidal ideas.      Objective:     BP (!) 142/82   Pulse 63   Ht 5\' 10"  (1.778 m)   Wt 183 lb 6.4 oz (83.2 kg)   SpO2 97%   BMI 26.32 kg/m  BP Readings from Last 3 Encounters:  12/13/22 (!) 142/82  11/15/22 (!) 168/85  09/25/22 (!) 155/84   Physical Exam Vitals reviewed.  Constitutional:      General: He is not in acute distress.    Appearance: Normal appearance. He is not ill-appearing.  HENT:     Head: Normocephalic and atraumatic.     Right Ear: External ear normal.     Left Ear: External ear normal.     Nose: Nose normal. No congestion or rhinorrhea.     Mouth/Throat:     Mouth: Mucous membranes are moist.     Pharynx: Oropharynx is clear.  Eyes:     General: No scleral icterus.    Extraocular Movements: Extraocular movements intact.     Conjunctiva/sclera:  Conjunctivae normal.     Pupils: Pupils are equal, round, and reactive to light.  Cardiovascular:     Rate and Rhythm: Normal rate and regular rhythm.     Pulses: Normal pulses.     Heart sounds: Normal heart sounds. No murmur heard. Pulmonary:     Effort: Pulmonary effort is normal.     Breath sounds: Normal breath sounds. No wheezing, rhonchi or rales.  Abdominal:     General: Abdomen is flat. Bowel sounds are normal. There is no distension.     Palpations: Abdomen is soft.     Tenderness: There is no abdominal tenderness.  Musculoskeletal:        General: No swelling or deformity. Normal range of motion.     Cervical back: Normal range of motion.  Skin:    General: Skin is warm and dry.     Capillary Refill: Capillary refill takes less than 2 seconds.  Neurological:     General: No focal deficit  present.     Mental Status: He is alert and oriented to person, place, and time.     Motor: No weakness.  Psychiatric:        Mood and Affect: Mood normal.        Behavior: Behavior normal.        Thought Content: Thought content normal.   Last CBC Lab Results  Component Value Date   WBC 12.2 (H) 09/25/2022   HGB 9.2 (L) 09/25/2022   HCT 28.8 (L) 09/25/2022   MCV 87.0 09/25/2022   MCH 27.8 09/25/2022   RDW 12.8 09/25/2022   PLT 388 [id number]   Last metabolic panel Lab Results  Component Value Date   GLUCOSE 231 (H) 09/25/2022   NA 142 09/25/2022   K 3.9 09/25/2022   CL 104 09/25/2022   CO2 28 09/25/2022   BUN 40 (H) 09/25/2022   CREATININE 2.87 (H) 09/25/2022   GFRNONAA 24 (L) 09/25/2022   CALCIUM 9.9 09/25/2022   PROT 6.6 07/31/2022   ALBUMIN 4.5 07/31/2022   LABGLOB 2.1 07/31/2022   AGRATIO 2.1 07/31/2022   BILITOT 0.4 07/31/2022   ALKPHOS 67 07/31/2022   AST 11 07/31/2022   ALT 10 07/31/2022   ANIONGAP 10 09/25/2022   Last lipids Lab Results  Component Value Date   CHOL 114 07/31/2022   HDL 26 (L) 07/31/2022   LDLCALC 61 07/31/2022   TRIG 156 (H) 07/31/2022   CHOLHDL 4.4 07/31/2022   Last hemoglobin A1c Lab Results  Component Value Date   HGBA1C 10.2 (A) 11/15/2022   Last thyroid functions Lab Results  Component Value Date   TSH 2.570 07/31/2022   Last vitamin D Lab Results  Component Value Date   VD25OH 29.6 (L) 07/31/2022      Assessment & Plan:   Problem List Items Addressed This Visit       DM2 (diabetes mellitus, type 2) ([place]) - Primary    A1c 10.2 last month.  Trulicity and Jardiance were added.  He is additionally prescribed glipizide 5 mg twice daily.  Metformin recently discontinued in the setting of CKD.  He has completed one injection of Trulicity [id number] mg and has been taking Jardiance as prescribed.  He has not experienced any adverse side effects. -Increase Trulicity to 1.5 mg weekly after he completes 3 additional 0.75 mg  injections -Continue Jardiance and glipizide at current doses -Follow-up in late May for reassessment and repeat A1c      Return in about 10 weeks (  around 02/21/2023).   Johnette Abraham, MD

## 2022-12-18 ENCOUNTER — Encounter: Payer: Self-pay | Admitting: Internal Medicine

## 2022-12-18 NOTE — Assessment & Plan Note (Signed)
A1c 10.2 last month.  Trulicity and Jardiance were added.  He is additionally prescribed glipizide 5 mg twice daily.  Metformin recently discontinued in the setting of CKD.  He has completed one injection of Trulicity [id number] mg and has been taking Jardiance as prescribed.  He has not experienced any adverse side effects. -Increase Trulicity to 1.5 mg weekly after he completes 3 additional 0.75 mg injections -Continue Jardiance and glipizide at current doses -Follow-up in late May for reassessment and repeat A1c

## 2022-12-31 ENCOUNTER — Other Ambulatory Visit: Payer: Self-pay

## 2022-12-31 DIAGNOSIS — E1100 Type 2 diabetes mellitus with hyperosmolarity without nonketotic hyperglycemic-hyperosmolar coma (NKHHC): Secondary | ICD-10-CM

## 2022-12-31 MED ORDER — GLIPIZIDE ER 5 MG PO TB24
5.0000 mg | ORAL_TABLET | Freq: Two times a day (BID) | ORAL | 2 refills | Status: DC
Start: 2022-12-31 — End: 2023-07-18

## 2023-01-09 ENCOUNTER — Encounter: Payer: Self-pay | Admitting: Emergency Medicine

## 2023-01-09 ENCOUNTER — Ambulatory Visit
Admission: EM | Admit: 2023-01-09 | Discharge: 2023-01-09 | Disposition: A | Discharge status: Home / Self Care | Payer: 59 | Attending: Nurse Practitioner | Admitting: Nurse Practitioner

## 2023-01-09 ENCOUNTER — Ambulatory Visit (INDEPENDENT_AMBULATORY_CARE_PROVIDER_SITE_OTHER): Payer: 59

## 2023-01-09 DIAGNOSIS — M79642 Pain in left hand: Secondary | ICD-10-CM | POA: Diagnosis not present

## 2023-01-09 DIAGNOSIS — S60222A Contusion of left hand, initial encounter: Secondary | ICD-10-CM

## 2023-01-09 DIAGNOSIS — S60552A Superficial foreign body of left hand, initial encounter: Secondary | ICD-10-CM | POA: Diagnosis not present

## 2023-01-09 DIAGNOSIS — M7989 Other specified soft tissue disorders: Secondary | ICD-10-CM | POA: Diagnosis not present

## 2023-01-09 NOTE — ED Provider Notes (Signed)
[facility]    CSN: [id number] Arrival date & time: 01/09/23  1742      History   Chief Complaint No chief complaint on file.   HPI Gregory Gordon is a 62 y.o. male.   The history is provided by the patient.   The patient presents for complaints of left hand pain that occurred 2 days ago.  Patient states he was at work when the metal latch from a box fell and hit the back of his left hand.  He states since that time, he has had pain and numbness in the hand.  He states that he has also had swelling that has increased.  Patient denies numbness, tingling, or radiation of pain.  He states he has increased pain when moving his fingers.  He also states he is unable to make a fist.  Patient reports that he has been taking Tylenol for pain.  He denies any previous injury or trauma.  Patient reports he is not filing this work-related injury under Worker's Comp. Past Medical History:  Diagnosis Date   Diabetes mellitus without complication    High cholesterol    Hyperlipemia    Hypertension    Stroke     Patient Active Problem List   Diagnosis Date Noted   CKD (chronic kidney disease) stage 4, GFR 15-29 ml/min 11/15/2022   COPD suggested by initial evaluation 09/26/2022   Tobacco use 07/17/2022   Arterial ischemic stroke, vertebrobasilar, cerebellar, acute 12/08/2012   DM2 (diabetes mellitus, type 2) 12/08/2012   Dyslipidemia 12/08/2012   HTN (hypertension) 12/08/2012   Family history of early CAD 12/08/2012   Q waves suggestive of previous myocardial infarction 12/08/2012    Past Surgical History:  Procedure Laterality Date   neg hx     TEE WITHOUT CARDIOVERSION N/A 12/10/2012   Procedure: TRANSESOPHAGEAL ECHOCARDIOGRAM (TEE);  Surgeon: Lewayne Bunting, MD;  Location: [facility] ENDOSCOPY;  Service: Cardiovascular;  Laterality: N/A;  Rm 4N24       Home Medications    Prior to Admission medications   Medication Sig Start Date End Date Taking? Authorizing Provider   albuterol (VENTOLIN HFA) 108 (90 Base) MCG/ACT inhaler Inhale 2 puffs into the lungs every 6 (six) hours as needed for wheezing or shortness of breath. 09/25/22   Gardenia Phlegm, MD  amLODipine (NORVASC) 5 MG tablet Take 1 tablet (5 mg total) by mouth every evening. 07/17/22   Gilmore Laroche, FNP  aspirin 81 MG chewable tablet Chew 81 mg by mouth daily.    [provider]  carvedilol (COREG) 3.125 MG tablet Take 1 tablet (3.125 mg total) by mouth 2 (two) times daily. 05/08/22   Quintella Reichert, MD  chlorthalidone (HYGROTON) 25 MG tablet Take 1 tablet (25 mg total) by mouth every other day. Patient taking differently: Take 25 mg by mouth daily. 07/17/22 07/17/23  Gilmore Laroche, FNP  cloNIDine (CATAPRES) 0.2 MG tablet Take 0.2 mg by mouth 3 (three) times daily. 02/19/22   [provider]  Dulaglutide (TRULICITY) 1.5 MG/0.5ML SOPN Inject 1.5 mg into the skin once a week for 28 days. 12/13/22 01/10/23  Billie Lade, MD  empagliflozin (JARDIANCE) 10 MG TABS tablet Take 1 tablet (10 mg total) by mouth daily before breakfast. 11/15/22 02/13/23  Billie Lade, MD  gabapentin (NEURONTIN) 100 MG capsule Take 1 capsule (100 mg total) by mouth every evening. 07/17/22   Gilmore Laroche, FNP  glipiZIDE (GLUCOTROL XL) 5 MG 24 hr tablet Take 1  tablet (5 mg total) by mouth in the morning and at bedtime. 12/31/22   Billie Lade, MD  losartan (COZAAR) 100 MG tablet Take 1 tablet (100 mg total) by mouth daily. 07/17/22   Gilmore Laroche, FNP  Omega-3 Fatty Acids (FISH OIL) 1000 MG CAPS Take 1,000 mg by mouth in the morning and at bedtime.    [provider]  pravastatin (PRAVACHOL) 80 MG tablet Take 1 tablet (80 mg total) by mouth at bedtime. 07/17/22   Gilmore Laroche, FNP    Family History Family History  Problem Relation Age of Onset   Heart attack Brother 79       Younger brother just had 2nd MI    Social History Social History   Tobacco Use   Smoking status: Every Day     Packs/day: 1.00    Years: 45.00    Additional pack years: 0.00    Total pack years: 45.00    Types: Cigarettes   Smokeless tobacco: Never  Vaping Use   Vaping Use: Never used  Substance Use Topics   Alcohol use: No   Drug use: No     Allergies   Patient has no known allergies.   Review of Systems Review of Systems Per HPI  Physical Exam Triage Vital Signs ED Triage Vitals  Enc Vitals Group     BP 01/09/23 1820 136/86     Pulse Rate 01/09/23 1820 68     Resp 01/09/23 1820 18     Temp 01/09/23 1820 98.1 F (36.7 C)     Temp Source 01/09/23 1820 Oral     SpO2 01/09/23 1820 95 %     Weight --      Height --      Head Circumference --      Peak Flow --      Pain Score 01/09/23 1821 5     Pain Loc --      Pain Edu? --      Excl. in GC? --    No data found.  Updated Vital Signs BP 136/86 (BP Location: Right Arm)   Pulse 68   Temp 98.1 F (36.7 C) (Oral)   Resp 18   SpO2 95%   Visual Acuity Right Eye Distance:   Left Eye Distance:   Bilateral Distance:    Right Eye Near:   Left Eye Near:    Bilateral Near:     Physical Exam Vitals and nursing note reviewed.  Constitutional:      Appearance: Normal appearance. He is not toxic-appearing.  HENT:     Head: Normocephalic.  Eyes:     Extraocular Movements: Extraocular movements intact.     Pupils: Pupils are equal, round, and reactive to light.  Pulmonary:     Effort: Pulmonary effort is normal.  Musculoskeletal:     Left hand: Swelling and tenderness present. No deformity. Decreased range of motion. Decreased strength (Due to pain.). Normal sensation. Normal capillary refill. Normal pulse.     Cervical back: Normal range of motion.     Comments: Increased swelling noted to the dorsal aspect of the left hand.  Has full range of motion, but pain increases when patient is trying to touch the thumb to the fourth and fifth digits.  He also has tenderness to the dorsal aspect of the left hand.  Neurovascular  status is intact.   Lymphadenopathy:     Cervical: No cervical adenopathy.  Skin:    General: Skin is warm  and dry.  Neurological:     General: No focal deficit present.     Mental Status: He is alert and oriented to person, place, and time.  Psychiatric:        Mood and Affect: Mood normal.        Behavior: Behavior normal.      UC Treatments / Results  Labs (all labs ordered are listed, but only abnormal results are displayed) Labs Reviewed - No data to display  EKG   Radiology DG Hand Complete Left  Result Date: 01/09/2023 CLINICAL DATA:  Left hand pain and swelling, injury 2 days ago EXAM: LEFT HAND - COMPLETE 3+ VIEW COMPARISON:  None Available. FINDINGS: Frontal, oblique, and lateral views of the left hand are obtained. There are no acute displaced fractures. Punctate metallic foreign body is seen within the distal palmar soft tissues of the fourth digit, age indeterminate. There is marked joint space narrowing and osteophyte formation at the first through third metacarpophalangeal joints, as well as within the second proximal and distal interphalangeal joints. There is mild diffuse joint space narrowing throughout the visualized left wrist. Mild dorsal soft tissue swelling. IMPRESSION: 1. No acute displaced fracture. 2. Punctate metallic foreign body within the palmar soft tissues distal margin fourth digit, age indeterminate. 3. Mild dorsal soft tissue swelling. 4. 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 Electronically Signed   By: Sharlet Salina M.D.   On: 01/09/2023 18:34    Procedures Procedures (including critical care time)  Medications Ordered in UC Medications - No data to display  Initial Impression / Assessment and Plan / UC Course  I have reviewed the triage vital signs and the nursing notes.  Pertinent labs & imaging results that were available during my care of the patient were reviewed by me and considered in my medical  decision making (see chart for details).  The patient is well-appearing, he is in no acute distress, vital signs are stable.  X-ray is negative for fracture or dislocation.  There does remain a metal object in the distal tip of the fourth digit of the left hand.  Patient with possible contusion to the left hand based on the mechanism of injury.  Ace wrap was provided to provide compression and support and to help decrease swelling.  Supportive care recommendations were provided to the patient to include RICE therapy, continuing over-the-counter Tylenol for pain or discomfort, and gentle range of motion exercises of the left hand.  Patient informs that he is not going to file this injury under his Worker's Compensation.  Patient advised that if symptoms fail to improve, recommend that he follow-up with his primary care physician or with orthopedics for further evaluation.  Patient is in agreement with this plan of care and verbalizes understanding.  All questions were answered.  Patient stable for discharge.   Final Clinical Impressions(s) / UC Diagnoses   Final diagnoses:  Contusion of left hand, initial encounter  Metal foreign body in hand, left, initial encounter     Discharge Instructions      The x-ray is negative for fracture or dislocation.  There is a metal object located in the distal tip of the fourth finger.  Based on the mechanism of injury, it is likely that you have a bruise or contusion to the left hand. Wear the Ace wrap to provide compression and support, and to help with swelling. RICE therapy, rest, ice, compression, and elevation while symptoms persist.  Apply ice  for 20 minutes, remove for 1 hour, then repeat as needed. Gentle stretching and range of motion exercises of the left hand to decrease her recovery time. Continue Tylenol 650 mg tablets as needed for pain or discomfort. If symptoms do not improve, recommend following up with your primary care physician or with  orthopedics for further evaluation. Follow-up as needed.     ED Prescriptions   None    PDMP not reviewed this encounter.   Abran Cantor, NP 01/09/23 [phone]

## 2023-01-09 NOTE — Discharge Instructions (Addendum)
The x-ray is negative for fracture or dislocation.  There is a metal object located in the distal tip of the fourth finger.  Based on the mechanism of injury, it is likely that you have a bruise or contusion to the left hand. Wear the Ace wrap to provide compression and support, and to help with swelling. RICE therapy, rest, ice, compression, and elevation while symptoms persist.  Apply ice for 20 minutes, remove for 1 hour, then repeat as needed. Gentle stretching and range of motion exercises of the left hand to decrease her recovery time. Continue Tylenol 650 mg tablets as needed for pain or discomfort. If symptoms do not improve, recommend following up with your primary care physician or with orthopedics for further evaluation. Follow-up as needed.

## 2023-01-09 NOTE — ED Triage Notes (Addendum)
Metal latch on a box hit left hand on Tuesday.  Hand swollen.  Able to move fingers.  Has been taking tylenol for pain.

## 2023-01-12 ENCOUNTER — Other Ambulatory Visit: Payer: Self-pay | Admitting: Internal Medicine

## 2023-01-12 DIAGNOSIS — E119 Type 2 diabetes mellitus without complications: Secondary | ICD-10-CM

## 2023-01-20 DIAGNOSIS — N189 Chronic kidney disease, unspecified: Secondary | ICD-10-CM | POA: Diagnosis not present

## 2023-01-20 DIAGNOSIS — E1129 Type 2 diabetes mellitus with other diabetic kidney complication: Secondary | ICD-10-CM | POA: Diagnosis not present

## 2023-01-20 DIAGNOSIS — D631 Anemia in chronic kidney disease: Secondary | ICD-10-CM | POA: Diagnosis not present

## 2023-01-20 DIAGNOSIS — I5032 Chronic diastolic (congestive) heart failure: Secondary | ICD-10-CM | POA: Diagnosis not present

## 2023-01-20 DIAGNOSIS — R809 Proteinuria, unspecified: Secondary | ICD-10-CM | POA: Diagnosis not present

## 2023-01-20 DIAGNOSIS — E1122 Type 2 diabetes mellitus with diabetic chronic kidney disease: Secondary | ICD-10-CM | POA: Diagnosis not present

## 2023-01-20 DIAGNOSIS — N170 Acute kidney failure with tubular necrosis: Secondary | ICD-10-CM | POA: Diagnosis not present

## 2023-02-11 ENCOUNTER — Other Ambulatory Visit: Payer: Self-pay | Admitting: Internal Medicine

## 2023-02-11 DIAGNOSIS — E119 Type 2 diabetes mellitus without complications: Secondary | ICD-10-CM

## 2023-02-21 ENCOUNTER — Ambulatory Visit: Payer: 59 | Admitting: Internal Medicine

## 2023-03-03 ENCOUNTER — Encounter: Payer: Self-pay | Admitting: Internal Medicine

## 2023-03-03 ENCOUNTER — Ambulatory Visit: Payer: 59 | Admitting: Internal Medicine

## 2023-03-03 VITALS — BP 119/75 | HR 65 | Ht 69.0 in | Wt 179.2 lb

## 2023-03-03 DIAGNOSIS — M25562 Pain in left knee: Secondary | ICD-10-CM

## 2023-03-03 DIAGNOSIS — Z720 Tobacco use: Secondary | ICD-10-CM

## 2023-03-03 DIAGNOSIS — N184 Chronic kidney disease, stage 4 (severe): Secondary | ICD-10-CM

## 2023-03-03 DIAGNOSIS — G8929 Other chronic pain: Secondary | ICD-10-CM | POA: Diagnosis not present

## 2023-03-03 DIAGNOSIS — Z7984 Long term (current) use of oral hypoglycemic drugs: Secondary | ICD-10-CM

## 2023-03-03 DIAGNOSIS — E119 Type 2 diabetes mellitus without complications: Secondary | ICD-10-CM | POA: Diagnosis not present

## 2023-03-03 DIAGNOSIS — Z122 Encounter for screening for malignant neoplasm of respiratory organs: Secondary | ICD-10-CM

## 2023-03-03 LAB — POCT GLYCOSYLATED HEMOGLOBIN (HGB A1C): Hemoglobin A1C: 7.1 % — AB (ref 4.0–5.6)

## 2023-03-03 MED ORDER — GABAPENTIN 100 MG PO CAPS
100.0000 mg | ORAL_CAPSULE | Freq: Every evening | ORAL | 1 refills | Status: AC
Start: 2023-03-03 — End: ?

## 2023-03-03 MED ORDER — TRULICITY 1.5 MG/0.5ML ~~LOC~~ SOAJ: 1.5000 mg | SUBCUTANEOUS | 2 refills | Status: DC

## 2023-03-03 MED ORDER — EMPAGLIFLOZIN 10 MG PO TABS
10.0000 mg | ORAL_TABLET | Freq: Every day | ORAL | 1 refills | Status: DC
Start: 2023-03-03 — End: 2023-07-18

## 2023-03-03 NOTE — Progress Notes (Signed)
Established Patient Office Visit  Subjective   Patient ID: Gregory Gordon, male    DOB: 1961/03/11  Age: 62 y.o. MRN: [id number]  Chief Complaint  Patient presents with   Diabetes    Follow up   Gregory Gordon returns to care today for DM follow-up.  Last evaluated by me on 3/22.  Trulicity was increased to 1.5 mg weekly at that time.  In the interim he presented to the emergency department on 4/18 for evaluation of left hand pain.  He has also been seen by nephrology for follow-up.  There have otherwise been no acute interval events.  Gregory Gordon reports feeling well today.  He is asymptomatic and has no acute concerns to discuss.  Past Medical History:  Diagnosis Date   Diabetes mellitus without complication (HCC)    High cholesterol    Hyperlipemia    Hypertension    Stroke [facility])    Past Surgical History:  Procedure Laterality Date   neg hx     TEE WITHOUT CARDIOVERSION N/A 12/10/2012   Procedure: TRANSESOPHAGEAL ECHOCARDIOGRAM (TEE);  Surgeon: Lewayne Bunting, MD;  Location: [facility] ENDOSCOPY;  Service: Cardiovascular;  Laterality: N/A;  Rm 4N24   Social History   Tobacco Use   Smoking status: Every Day    Packs/day: 1.00    Years: 45.00    Additional pack years: 0.00    Total pack years: 45.00    Types: Cigarettes   Smokeless tobacco: Never  Vaping Use   Vaping Use: Never used  Substance Use Topics   Alcohol use: No   Drug use: No   Family History  Problem Relation Age of Onset   Heart attack Brother 33       Younger brother just had 2nd MI   No Known Allergies  Review of Systems  Constitutional:  Negative for chills and fever.  HENT:  Negative for sore throat.   Respiratory:  Negative for cough and shortness of breath.   Cardiovascular:  Negative for chest pain, palpitations and leg swelling.  Gastrointestinal:  Negative for abdominal pain, blood in stool, constipation, diarrhea, nausea and vomiting.  Genitourinary:  Negative for dysuria and hematuria.   Musculoskeletal:  Negative for myalgias.  Skin:  Negative for itching and rash.  Neurological:  Negative for dizziness and headaches.  Psychiatric/Behavioral:  Negative for depression and suicidal ideas.      Objective:     BP 119/75   Pulse 65   Ht 5\' 9"  (1.753 m)   Wt 179 lb 3.2 oz (81.3 kg)   SpO2 97%   BMI 26.46 kg/m  BP Readings from Last 3 Encounters:  03/03/23 119/75  01/09/23 136/86  12/13/22 (!) 142/82   Physical Exam Vitals reviewed.  Constitutional:      General: He is not in acute distress.    Appearance: Normal appearance. He is not ill-appearing.  HENT:     Head: Normocephalic and atraumatic.     Right Ear: External ear normal.     Left Ear: External ear normal.     Nose: Nose normal. No congestion or rhinorrhea.     Mouth/Throat:     Mouth: Mucous membranes are moist.     Pharynx: Oropharynx is clear.  Eyes:     General: No scleral icterus.    Extraocular Movements: Extraocular movements intact.     Conjunctiva/sclera: Conjunctivae normal.     Pupils: Pupils are equal, round, and reactive to light.  Cardiovascular:     Rate  and Rhythm: Normal rate and regular rhythm.     Pulses: Normal pulses.     Heart sounds: Normal heart sounds. No murmur heard. Pulmonary:     Effort: Pulmonary effort is normal.     Breath sounds: Normal breath sounds. No wheezing, rhonchi or rales.  Abdominal:     General: Abdomen is flat. Bowel sounds are normal. There is no distension.     Palpations: Abdomen is soft.     Tenderness: There is no abdominal tenderness.  Musculoskeletal:        General: No swelling or deformity. Normal range of motion.     Cervical back: Normal range of motion.  Skin:    General: Skin is warm and dry.     Capillary Refill: Capillary refill takes less than 2 seconds.  Neurological:     General: No focal deficit present.     Mental Status: He is alert and oriented to person, place, and time.     Motor: No weakness.  Psychiatric:         Mood and Affect: Mood normal.        Behavior: Behavior normal.        Thought Content: Thought content normal.   Last CBC Lab Results  Component Value Date   WBC 12.2 (H) 09/25/2022   HGB 9.2 (L) 09/25/2022   HCT 28.8 (L) 09/25/2022   MCV 87.0 09/25/2022   MCH 27.8 09/25/2022   RDW 12.8 09/25/2022   PLT 388 09/25/2022   Last metabolic panel Lab Results  Component Value Date   GLUCOSE 231 (H) 09/25/2022   NA 142 09/25/2022   K 3.9 09/25/2022   CL 104 09/25/2022   CO2 28 09/25/2022   BUN 40 (H) 09/25/2022   CREATININE 2.87 (H) 09/25/2022   GFRNONAA 24 (L) 09/25/2022   CALCIUM 9.9 09/25/2022   PROT 6.6 07/31/2022   ALBUMIN 4.5 07/31/2022   LABGLOB 2.1 07/31/2022   AGRATIO 2.1 07/31/2022   BILITOT 0.4 07/31/2022   ALKPHOS 67 07/31/2022   AST 11 07/31/2022   ALT 10 07/31/2022   ANIONGAP 10 09/25/2022   Last lipids Lab Results  Component Value Date   CHOL 114 07/31/2022   HDL 26 (L) 07/31/2022   LDLCALC 61 07/31/2022   TRIG 156 (H) 07/31/2022   CHOLHDL 4.4 07/31/2022   Last hemoglobin A1c Lab Results  Component Value Date   HGBA1C 7.1 (A) 03/03/2023   Last thyroid functions Lab Results  Component Value Date   TSH 2.570 07/31/2022   Last vitamin D Lab Results  Component Value Date   VD25OH 29.6 (L) 07/31/2022     Assessment & Plan:   Problem List Items Addressed This Visit       DM2 (diabetes mellitus, type 2) (HCC) - Primary    Presenting today for diabetes follow-up.  A1c increased to 10.2 in February after previously being well-controlled.  He is currently prescribed Trulicity 1.5 mg weekly, Jardiance 10 mg daily, and glipizide 5 mg twice daily.  POC A1c today has improved to 7.1. -No additional medication changes today.  Continue Trulicity 1.5 mg weekly, and Jardiance and glipizide as previously prescribed. -Ophthalmology referral placed today for diabetic eye exam -We will tentatively plan for follow-up in 3 months      Tobacco use     Currently smokes 1 pack/day and has been smoking since age 42.  He remains precontemplative with regards to cessation.  He is agreeable for the referral for lung cancer screening. -  Lung cancer screening program referral placed today      Return in about 3 months (around 06/03/2023).   Billie Lade, MD

## 2023-03-03 NOTE — Assessment & Plan Note (Signed)
Currently smokes 1 pack/day and has been smoking since age 62.  He remains precontemplative with regards to cessation.  He is agreeable for the referral for lung cancer screening. -Lung cancer screening program referral placed today

## 2023-03-03 NOTE — Patient Instructions (Signed)
It was a pleasure to see you today.  Thank you for giving [place] the opportunity to be involved in your care.  Below is a brief recap of your visit and next steps.  We will plan to see you again in 3 months.  Summary Medications refilled but no changes have been made today Ophthalmology and lung cancer screening referrals have been placed. Follow up in 3 months

## 2023-03-03 NOTE — Assessment & Plan Note (Signed)
Presenting today for diabetes follow-up.  A1c increased to 10.2 in February after previously being well-controlled.  He is currently prescribed Trulicity 1.5 mg weekly, Jardiance 10 mg daily, and glipizide 5 mg twice daily.  POC A1c today has improved to 7.1. -No additional medication changes today.  Continue Trulicity 1.5 mg weekly, and Jardiance and glipizide as previously prescribed. -Ophthalmology referral placed today for diabetic eye exam -We will tentatively plan for follow-up in 3 months

## 2023-03-19 DIAGNOSIS — I129 Hypertensive chronic kidney disease with stage 1 through stage 4 chronic kidney disease, or unspecified chronic kidney disease: Secondary | ICD-10-CM | POA: Diagnosis not present

## 2023-03-19 DIAGNOSIS — D631 Anemia in chronic kidney disease: Secondary | ICD-10-CM | POA: Diagnosis not present

## 2023-03-19 DIAGNOSIS — N1832 Chronic kidney disease, stage 3b: Secondary | ICD-10-CM | POA: Diagnosis not present

## 2023-03-19 DIAGNOSIS — R809 Proteinuria, unspecified: Secondary | ICD-10-CM | POA: Diagnosis not present

## 2023-03-22 DIAGNOSIS — E1122 Type 2 diabetes mellitus with diabetic chronic kidney disease: Secondary | ICD-10-CM | POA: Diagnosis not present

## 2023-03-22 DIAGNOSIS — E870 Hyperosmolality and hypernatremia: Secondary | ICD-10-CM | POA: Diagnosis not present

## 2023-03-22 DIAGNOSIS — D631 Anemia in chronic kidney disease: Secondary | ICD-10-CM | POA: Diagnosis not present

## 2023-03-22 DIAGNOSIS — N184 Chronic kidney disease, stage 4 (severe): Secondary | ICD-10-CM | POA: Diagnosis not present

## 2023-04-07 ENCOUNTER — Other Ambulatory Visit: Payer: Self-pay

## 2023-04-08 ENCOUNTER — Telehealth: Payer: Self-pay | Admitting: Internal Medicine

## 2023-04-08 DIAGNOSIS — E1169 Type 2 diabetes mellitus with other specified complication: Secondary | ICD-10-CM

## 2023-04-08 MED ORDER — OZEMPIC (0.25 OR 0.5 MG/DOSE) 2 MG/3ML ~~LOC~~ SOPN
0.2500 mg | PEN_INJECTOR | SUBCUTANEOUS | 0 refills | Status: DC
Start: 2023-04-08 — End: 2023-07-18

## 2023-04-08 NOTE — Telephone Encounter (Signed)
Called pt spouse back and let her know we will send in the ozempic

## 2023-04-08 NOTE — Telephone Encounter (Signed)
Patient spouse called had questions on changing one of medication patient is currently taking [place] will he be able to get on ozempic

## 2023-04-08 NOTE — Addendum Note (Signed)
Addended by: Christel Mormon E on: 04/08/2023 10:56 AM   Modules accepted: Orders

## 2023-04-24 NOTE — Telephone Encounter (Signed)
Patient spouse called asking what other medication cheaper than trucitiy  does patient need to use asked for a call back [phone]

## 2023-04-25 DIAGNOSIS — N189 Chronic kidney disease, unspecified: Secondary | ICD-10-CM | POA: Diagnosis not present

## 2023-04-25 DIAGNOSIS — K551 Chronic vascular disorders of intestine: Secondary | ICD-10-CM | POA: Diagnosis not present

## 2023-04-25 DIAGNOSIS — E870 Hyperosmolality and hypernatremia: Secondary | ICD-10-CM | POA: Diagnosis not present

## 2023-04-25 DIAGNOSIS — N184 Chronic kidney disease, stage 4 (severe): Secondary | ICD-10-CM | POA: Diagnosis not present

## 2023-04-25 DIAGNOSIS — E1129 Type 2 diabetes mellitus with other diabetic kidney complication: Secondary | ICD-10-CM | POA: Diagnosis not present

## 2023-04-25 DIAGNOSIS — I5032 Chronic diastolic (congestive) heart failure: Secondary | ICD-10-CM | POA: Diagnosis not present

## 2023-04-25 DIAGNOSIS — D649 Anemia, unspecified: Secondary | ICD-10-CM | POA: Diagnosis not present

## 2023-04-25 DIAGNOSIS — D631 Anemia in chronic kidney disease: Secondary | ICD-10-CM | POA: Diagnosis not present

## 2023-04-25 DIAGNOSIS — E1122 Type 2 diabetes mellitus with diabetic chronic kidney disease: Secondary | ICD-10-CM | POA: Diagnosis not present

## 2023-04-26 ENCOUNTER — Other Ambulatory Visit: Payer: Self-pay | Admitting: Family Medicine

## 2023-04-26 DIAGNOSIS — E7849 Other hyperlipidemia: Secondary | ICD-10-CM

## 2023-05-01 DIAGNOSIS — D631 Anemia in chronic kidney disease: Secondary | ICD-10-CM | POA: Diagnosis not present

## 2023-05-01 DIAGNOSIS — N184 Chronic kidney disease, stage 4 (severe): Secondary | ICD-10-CM | POA: Diagnosis not present

## 2023-05-01 DIAGNOSIS — E1129 Type 2 diabetes mellitus with other diabetic kidney complication: Secondary | ICD-10-CM | POA: Diagnosis not present

## 2023-05-01 DIAGNOSIS — E1122 Type 2 diabetes mellitus with diabetic chronic kidney disease: Secondary | ICD-10-CM | POA: Diagnosis not present

## 2023-06-02 ENCOUNTER — Telehealth: Payer: Self-pay | Admitting: Internal Medicine

## 2023-06-06 ENCOUNTER — Ambulatory Visit: Payer: 59 | Admitting: Internal Medicine

## 2023-06-11 ENCOUNTER — Ambulatory Visit: Payer: 59 | Admitting: Internal Medicine

## 2023-07-18 ENCOUNTER — Encounter: Payer: Self-pay | Admitting: Internal Medicine

## 2023-07-18 ENCOUNTER — Ambulatory Visit: Payer: 59 | Admitting: Internal Medicine

## 2023-07-18 VITALS — BP 130/72 | HR 68 | Ht 67.0 in | Wt 184.6 lb

## 2023-07-18 DIAGNOSIS — N184 Chronic kidney disease, stage 4 (severe): Secondary | ICD-10-CM

## 2023-07-18 DIAGNOSIS — I10 Essential (primary) hypertension: Secondary | ICD-10-CM

## 2023-07-18 DIAGNOSIS — Z2821 Immunization not carried out because of patient refusal: Secondary | ICD-10-CM

## 2023-07-18 DIAGNOSIS — E1100 Type 2 diabetes mellitus with hyperosmolarity without nonketotic hyperglycemic-hyperosmolar coma (NKHHC): Secondary | ICD-10-CM

## 2023-07-18 DIAGNOSIS — E1169 Type 2 diabetes mellitus with other specified complication: Secondary | ICD-10-CM | POA: Diagnosis not present

## 2023-07-18 DIAGNOSIS — E119 Type 2 diabetes mellitus without complications: Secondary | ICD-10-CM | POA: Diagnosis not present

## 2023-07-18 MED ORDER — OZEMPIC (0.25 OR 0.5 MG/DOSE) 2 MG/3ML ~~LOC~~ SOPN
0.2500 mg | PEN_INJECTOR | SUBCUTANEOUS | 0 refills | Status: DC
Start: 2023-07-18 — End: 2023-10-17

## 2023-07-18 MED ORDER — CLONIDINE HCL 0.2 MG PO TABS
0.2000 mg | ORAL_TABLET | Freq: Three times a day (TID) | ORAL | 3 refills | Status: DC
Start: 2023-07-18 — End: 2024-01-21

## 2023-07-18 MED ORDER — CHLORTHALIDONE 25 MG PO TABS
25.0000 mg | ORAL_TABLET | ORAL | 3 refills | Status: DC
Start: 2023-07-18 — End: 2024-04-19

## 2023-07-18 MED ORDER — CARVEDILOL 3.125 MG PO TABS: 3.1250 mg | ORAL_TABLET | Freq: Two times a day (BID) | ORAL | 3 refills | Status: AC

## 2023-07-18 MED ORDER — GLIPIZIDE ER 5 MG PO TB24: 5.0000 mg | ORAL_TABLET | Freq: Two times a day (BID) | ORAL | 2 refills | Status: DC

## 2023-07-18 MED ORDER — EMPAGLIFLOZIN 10 MG PO TABS
10.0000 mg | ORAL_TABLET | Freq: Every day | ORAL | 1 refills | Status: DC
Start: 2023-07-18 — End: 2023-07-18

## 2023-07-18 MED ORDER — LOSARTAN POTASSIUM 100 MG PO TABS
100.0000 mg | ORAL_TABLET | Freq: Every day | ORAL | 1 refills | Status: AC
Start: 2023-07-18 — End: ?

## 2023-07-18 MED ORDER — AMLODIPINE BESYLATE 5 MG PO TABS
5.0000 mg | ORAL_TABLET | Freq: Every evening | ORAL | 1 refills | Status: AC
Start: 2023-07-18 — End: ?

## 2023-07-18 NOTE — Progress Notes (Signed)
Established Patient Office Visit  Subjective   Patient ID: Gregory Gordon, male    DOB: 1961-07-06  Age: 62 y.o. MRN: [id number]  Chief Complaint  Patient presents with   Diabetes    Three month follow up    Gregory Gordon returns to care today for routine follow-up.  He was last evaluated by me on 6/10.  No medication changes were made at that time and he was referred for lung cancer screening.  There have been no acute interval events.  Gregory Gordon reports feeling well today.  His acute concern is medication issues.  He attempted to transition from Trulicity to Ozempic since his last appointment due to cost, but reports that he was never able to fill a prescription for Ozempic.  He has been off medication for 2-3 months and has not been taking Jardiance consistently either.  Past Medical History:  Diagnosis Date   Diabetes mellitus without complication (HCC)    High cholesterol    Hyperlipemia    Hypertension    Stroke [facility])    Past Surgical History:  Procedure Laterality Date   neg hx     TEE WITHOUT CARDIOVERSION N/A 12/10/2012   Procedure: TRANSESOPHAGEAL ECHOCARDIOGRAM (TEE);  Surgeon: Lewayne Bunting, MD;  Location: [facility] ENDOSCOPY;  Service: Cardiovascular;  Laterality: N/A;  Rm 4N24   Social History   Tobacco Use   Smoking status: Every Day    Current packs/day: 1.00    Average packs/day: 1 pack/day for 45.0 years (45.0 ttl pk-yrs)    Types: Cigarettes   Smokeless tobacco: Never  Vaping Use   Vaping status: Never Used  Substance Use Topics   Alcohol use: No   Drug use: No   Family History  Problem Relation Age of Onset   Heart attack Brother 44       Younger brother just had 2nd MI   No Known Allergies  Review of Systems  Constitutional:  Negative for chills and fever.  HENT:  Negative for sore throat.   Respiratory:  Negative for cough and shortness of breath.   Cardiovascular:  Negative for chest pain, palpitations and leg swelling.  Gastrointestinal:   Negative for abdominal pain, blood in stool, constipation, diarrhea, nausea and vomiting.  Genitourinary:  Negative for dysuria and hematuria.  Musculoskeletal:  Negative for myalgias.  Skin:  Negative for itching and rash.  Neurological:  Negative for dizziness and headaches.  Psychiatric/Behavioral:  Negative for depression and suicidal ideas.      Objective:     BP 130/72   Pulse 68   Ht 5\' 7"  (1.702 m)   Wt 184 lb 9.6 oz (83.7 kg)   SpO2 95%   BMI 28.91 kg/m  BP Readings from Last 3 Encounters:  07/18/23 130/72  03/03/23 119/75  01/09/23 136/86   Physical Exam Vitals reviewed.  Constitutional:      General: He is not in acute distress.    Appearance: Normal appearance. He is not ill-appearing.  HENT:     Head: Normocephalic and atraumatic.     Right Ear: External ear normal.     Left Ear: External ear normal.     Nose: Nose normal. No congestion or rhinorrhea.     Mouth/Throat:     Mouth: Mucous membranes are moist.     Pharynx: Oropharynx is clear.  Eyes:     General: No scleral icterus.    Extraocular Movements: Extraocular movements intact.     Conjunctiva/sclera: Conjunctivae normal.  Pupils: Pupils are equal, round, and reactive to light.  Cardiovascular:     Rate and Rhythm: Normal rate and regular rhythm.     Pulses: Normal pulses.     Heart sounds: Normal heart sounds. No murmur heard. Pulmonary:     Effort: Pulmonary effort is normal.     Breath sounds: Normal breath sounds. No wheezing, rhonchi or rales.  Abdominal:     General: Abdomen is flat. Bowel sounds are normal. There is no distension.     Palpations: Abdomen is soft.     Tenderness: There is no abdominal tenderness.  Musculoskeletal:        General: No swelling or deformity. Normal range of motion.     Cervical back: Normal range of motion.  Skin:    General: Skin is warm and dry.     Capillary Refill: Capillary refill takes less than 2 seconds.  Neurological:     General: No focal  deficit present.     Mental Status: He is alert and oriented to person, place, and time.     Motor: No weakness.  Psychiatric:        Mood and Affect: Mood normal.        Behavior: Behavior normal.        Thought Content: Thought content normal.   Last CBC Lab Results  Component Value Date   WBC 12.2 (H) 09/25/2022   HGB 9.2 (L) 09/25/2022   HCT 28.8 (L) 09/25/2022   MCV 87.0 09/25/2022   MCH 27.8 09/25/2022   RDW 12.8 09/25/2022   PLT 388 09/25/2022   Last metabolic panel Lab Results  Component Value Date   GLUCOSE 231 (H) 09/25/2022   NA 142 09/25/2022   K 3.9 09/25/2022   CL 104 09/25/2022   CO2 28 09/25/2022   BUN 40 (H) 09/25/2022   CREATININE 2.87 (H) 09/25/2022   GFRNONAA 24 (L) 09/25/2022   CALCIUM 9.9 09/25/2022   PROT 6.6 07/31/2022   ALBUMIN 4.5 07/31/2022   LABGLOB 2.1 07/31/2022   AGRATIO 2.1 07/31/2022   BILITOT 0.4 07/31/2022   ALKPHOS 67 07/31/2022   AST 11 07/31/2022   ALT 10 07/31/2022   ANIONGAP 10 09/25/2022   Last lipids Lab Results  Component Value Date   CHOL 114 07/31/2022   HDL 26 (L) 07/31/2022   LDLCALC 61 07/31/2022   TRIG 156 (H) 07/31/2022   CHOLHDL 4.4 07/31/2022   Last hemoglobin A1c Lab Results  Component Value Date   HGBA1C 7.1 (A) 03/03/2023   Last thyroid functions Lab Results  Component Value Date   TSH 2.570 07/31/2022   Last vitamin D Lab Results  Component Value Date   VD25OH 29.6 (L) 07/31/2022     Assessment & Plan:   Problem List Items Addressed This Visit       HTN (hypertension)    Remains adequately controlled on current antihypertensive regimen.  No medication changes were made today and refills were provided.      DM2 (diabetes mellitus, type 2) (HCC) - Primary    A1c 7.1 on labs from June.  He was prescribed Trulicity 1.5 mg weekly, Jardiance 10 mg daily, and glipizide 5 mg twice daily at that time.  In the interim, he requested to switch to a different medication from Trulicity due to cost.   Ozempic was prescribed, but a prescription was never filled.  He endorses compliance with glipizide, but has not taken Jardiance consistently. -Through shared decision making, a new prescription for  Ozempic 0.25 mg weekly has been sent today.  Continue glipizide 5 mg twice daily.  Discontinue Jardiance in the setting of CKD stage IV. -Repeat labs at follow-up in 3 months      CKD (chronic kidney disease) stage 4, GFR 15-29 ml/min (HCC)    CKD stage IV.  Jardiance discontinued today due to GFR < 30.  He has not been taking it consistently.  Nephrology follow-up is scheduled for next month.      Return in about 3 months (around 10/18/2023).   Billie Lade, MD

## 2023-07-18 NOTE — Assessment & Plan Note (Signed)
CKD stage IV.  Jardiance discontinued today due to GFR < 30.  He has not been taking it consistently.  Nephrology follow-up is scheduled for next month.

## 2023-07-18 NOTE — Assessment & Plan Note (Signed)
A1c 7.1 on labs from June.  He was prescribed Trulicity 1.5 mg weekly, Jardiance 10 mg daily, and glipizide 5 mg twice daily at that time.  In the interim, he requested to switch to a different medication from Trulicity due to cost.  Ozempic was prescribed, but a prescription was never filled.  He endorses compliance with glipizide, but has not taken Jardiance consistently. -Through shared decision making, a new prescription for Ozempic 0.25 mg weekly has been sent today.  Continue glipizide 5 mg twice daily.  Discontinue Jardiance in the setting of CKD stage IV. -Repeat labs at follow-up in 3 months

## 2023-07-18 NOTE — Assessment & Plan Note (Signed)
Remains adequately controlled on current antihypertensive regimen.  No medication changes were made today and refills were provided.

## 2023-07-18 NOTE — Patient Instructions (Signed)
It was a pleasure to see you today.  Thank you for giving [place] the opportunity to be involved in your care.  Below is a brief recap of your visit and next steps.  We will plan to see you again in 3 months.  Summary BP medications refilled STOP jardiance Will prescribed Ozempic again. Please call with any issues Follow up in 3 months

## 2023-07-23 ENCOUNTER — Other Ambulatory Visit: Payer: Self-pay

## 2023-07-24 ENCOUNTER — Other Ambulatory Visit: Payer: Self-pay | Admitting: Family Medicine

## 2023-07-24 DIAGNOSIS — E7849 Other hyperlipidemia: Secondary | ICD-10-CM

## 2023-08-01 DIAGNOSIS — N184 Chronic kidney disease, stage 4 (severe): Secondary | ICD-10-CM | POA: Diagnosis not present

## 2023-08-01 DIAGNOSIS — E1122 Type 2 diabetes mellitus with diabetic chronic kidney disease: Secondary | ICD-10-CM | POA: Diagnosis not present

## 2023-08-01 DIAGNOSIS — N189 Chronic kidney disease, unspecified: Secondary | ICD-10-CM | POA: Diagnosis not present

## 2023-08-01 DIAGNOSIS — E1129 Type 2 diabetes mellitus with other diabetic kidney complication: Secondary | ICD-10-CM | POA: Diagnosis not present

## 2023-08-01 DIAGNOSIS — D631 Anemia in chronic kidney disease: Secondary | ICD-10-CM | POA: Diagnosis not present

## 2023-08-20 DIAGNOSIS — N189 Chronic kidney disease, unspecified: Secondary | ICD-10-CM | POA: Diagnosis not present

## 2023-10-17 ENCOUNTER — Encounter: Payer: Self-pay | Admitting: Internal Medicine

## 2023-10-17 ENCOUNTER — Ambulatory Visit: Payer: 59 | Admitting: Internal Medicine

## 2023-10-17 VITALS — BP 124/74 | HR 63 | Resp 16 | Ht 66.0 in | Wt 185.0 lb

## 2023-10-17 DIAGNOSIS — Z7984 Long term (current) use of oral hypoglycemic drugs: Secondary | ICD-10-CM | POA: Diagnosis not present

## 2023-10-17 DIAGNOSIS — N184 Chronic kidney disease, stage 4 (severe): Secondary | ICD-10-CM

## 2023-10-17 DIAGNOSIS — E785 Hyperlipidemia, unspecified: Secondary | ICD-10-CM | POA: Diagnosis not present

## 2023-10-17 DIAGNOSIS — I10 Essential (primary) hypertension: Secondary | ICD-10-CM | POA: Diagnosis not present

## 2023-10-17 DIAGNOSIS — E1169 Type 2 diabetes mellitus with other specified complication: Secondary | ICD-10-CM | POA: Diagnosis not present

## 2023-10-17 DIAGNOSIS — Z720 Tobacco use: Secondary | ICD-10-CM

## 2023-10-17 MED ORDER — OZEMPIC (0.25 OR 0.5 MG/DOSE) 2 MG/3ML ~~LOC~~ SOPN: 0.2500 mg | PEN_INJECTOR | SUBCUTANEOUS | 0 refills | Status: DC

## 2023-10-17 NOTE — Assessment & Plan Note (Signed)
CKD stage IV.  Currently prescribed losartan and Jardiance.  Last seen by nephrology in November 2024.  Reports that he is scheduled for follow-up next month.  Repeat labs ordered today.

## 2023-10-17 NOTE — Patient Instructions (Signed)
It was a pleasure to see you today.  Thank you for giving [place] the opportunity to be involved in your care.  Below is a brief recap of your visit and next steps.  We will plan to see you again in 3 months.  Summary Resume Ozempic Repeat labs ordered Follow up in 3 months

## 2023-10-17 NOTE — Assessment & Plan Note (Signed)
A1c 7.1 on labs from June 2024.  He is currently prescribed Jardiance 10 mg daily and glipizide 5 mg twice daily.  Ozempic was restarted at his last appointment but he never filled the prescription.  We discussed discontinuing Jardiance in the setting of CKD stage IV but his nephrologist is in agreement with continuing SGLT2 therapy.  He is concerned that his blood sugar is elevated as he describes increased thirst recently. -New prescription for Ozempic 0.25 mg weekly sent today.  Continue Jardiance and glipizide as currently prescribed.  Repeat A1c ordered.

## 2023-10-17 NOTE — Progress Notes (Signed)
Established Patient Office Visit  Subjective   Patient ID: Gregory Gordon, male    DOB: 29-Nov-1960  Age: 63 y.o. MRN: [id number]  Chief Complaint  Patient presents with   Dizziness    Has a few episodes a month that last a minute or so where he gets very dizzy and feels off balance since his stroke   Diabetes    Still on jardiance- wondering if he should be on ozempic   Mr. Pickup returns to care today for routine follow-up.  He was last evaluated by me in October 2024.  At that time he reported being off Trulicity and Ozempic for multiple months due to cost issues.  Through shared decision making, Ozempic was restarted. No additional changes were made and 70-month follow-up was arranged.  There have been no acute interval events.  Today he reports feeling well.  He would like to review his current medications as believed plans were to resume Ozempic following his last appointment but this never happened.  He does not have any additional concerns to discuss.  Past Medical History:  Diagnosis Date   Diabetes mellitus without complication (HCC)    High cholesterol    Hyperlipemia    Hypertension    Stroke [facility])    Past Surgical History:  Procedure Laterality Date   neg hx     TEE WITHOUT CARDIOVERSION N/A 12/10/2012   Procedure: TRANSESOPHAGEAL ECHOCARDIOGRAM (TEE);  Surgeon: Lewayne Bunting, MD;  Location: [facility] ENDOSCOPY;  Service: Cardiovascular;  Laterality: N/A;  Rm 4N24   Social History   Tobacco Use   Smoking status: Every Day    Current packs/day: 1.00    Average packs/day: 1 pack/day for 45.0 years (45.0 ttl pk-yrs)    Types: Cigarettes   Smokeless tobacco: Never  Vaping Use   Vaping status: Never Used  Substance Use Topics   Alcohol use: No   Drug use: No   Family History  Problem Relation Age of Onset   Heart attack Brother 20       Younger brother just had 2nd MI   No Known Allergies  Review of Systems  Constitutional:  Negative for chills and fever.  HENT:   Negative for sore throat.   Respiratory:  Negative for cough and shortness of breath.   Cardiovascular:  Negative for chest pain, palpitations and leg swelling.  Gastrointestinal:  Negative for abdominal pain, blood in stool, constipation, diarrhea, nausea and vomiting.  Genitourinary:  Negative for dysuria and hematuria.  Musculoskeletal:  Negative for myalgias.  Skin:  Negative for itching and rash.  Neurological:  Negative for dizziness and headaches.  Psychiatric/Behavioral:  Negative for depression and suicidal ideas.      Objective:     BP 124/74   Pulse 63   Resp 16   Ht 5\' 6"  (1.676 m)   Wt 185 lb (83.9 kg)   SpO2 94%   BMI 29.86 kg/m  BP Readings from Last 3 Encounters:  10/17/23 124/74  07/18/23 130/72  03/03/23 119/75   Physical Exam Vitals reviewed.  Constitutional:      General: He is not in acute distress.    Appearance: Normal appearance. He is not ill-appearing.  HENT:     Head: Normocephalic and atraumatic.     Right Ear: External ear normal.     Left Ear: External ear normal.     Nose: Nose normal. No congestion or rhinorrhea.     Mouth/Throat:     Mouth: Mucous membranes  are moist.     Pharynx: Oropharynx is clear.  Eyes:     General: No scleral icterus.    Extraocular Movements: Extraocular movements intact.     Conjunctiva/sclera: Conjunctivae normal.     Pupils: Pupils are equal, round, and reactive to light.  Cardiovascular:     Rate and Rhythm: Normal rate and regular rhythm.     Pulses: Normal pulses.     Heart sounds: Normal heart sounds. No murmur heard. Pulmonary:     Effort: Pulmonary effort is normal.     Breath sounds: Normal breath sounds. No wheezing, rhonchi or rales.  Abdominal:     General: Abdomen is flat. Bowel sounds are normal. There is no distension.     Palpations: Abdomen is soft.     Tenderness: There is no abdominal tenderness.  Musculoskeletal:        General: No swelling or deformity. Normal range of motion.      Cervical back: Normal range of motion.  Skin:    General: Skin is warm and dry.     Capillary Refill: Capillary refill takes less than 2 seconds.  Neurological:     General: No focal deficit present.     Mental Status: He is alert and oriented to person, place, and time.     Motor: No weakness.  Psychiatric:        Mood and Affect: Mood normal.        Behavior: Behavior normal.        Thought Content: Thought content normal.   Last CBC Lab Results  Component Value Date   WBC 12.2 (H) 09/25/2022   HGB 9.2 (L) 09/25/2022   HCT 28.8 (L) 09/25/2022   MCV 87.0 09/25/2022   MCH 27.8 09/25/2022   RDW 12.8 09/25/2022   PLT 388 09/25/2022   Last metabolic panel Lab Results  Component Value Date   GLUCOSE 231 (H) 09/25/2022   NA 142 09/25/2022   K 3.9 09/25/2022   CL 104 09/25/2022   CO2 28 09/25/2022   BUN 40 (H) 09/25/2022   CREATININE 2.87 (H) 09/25/2022   GFRNONAA 24 (L) 09/25/2022   CALCIUM 9.9 09/25/2022   PROT 6.6 07/31/2022   ALBUMIN 4.5 07/31/2022   LABGLOB 2.1 07/31/2022   AGRATIO 2.1 07/31/2022   BILITOT 0.4 07/31/2022   ALKPHOS 67 07/31/2022   AST 11 07/31/2022   ALT 10 07/31/2022   ANIONGAP 10 09/25/2022   Last lipids Lab Results  Component Value Date   CHOL 114 07/31/2022   HDL 26 (L) 07/31/2022   LDLCALC 61 07/31/2022   TRIG 156 (H) 07/31/2022   CHOLHDL 4.4 07/31/2022   Last hemoglobin A1c Lab Results  Component Value Date   HGBA1C 7.1 (A) 03/03/2023   Last thyroid functions Lab Results  Component Value Date   TSH 2.570 07/31/2022   Last vitamin D Lab Results  Component Value Date   VD25OH 29.6 (L) 07/31/2022     Assessment & Plan:   Problem List Items Addressed This Visit       HTN (hypertension) - Primary   Remains adequately controlled on current antihypertensive regimen consisting of amlodipine 5 mg daily, carvedilol 3.125 mg twice daily, chlorthalidone 25 mg every other day, clonidine 0.2 mg 3 times daily, and losartan 100 mg  daily.  No medication changes are indicated at this time.      DM2 (diabetes mellitus, type 2) (HCC)   A1c 7.1 on labs from June 2024.  He is currently  prescribed Jardiance 10 mg daily and glipizide 5 mg twice daily.  Ozempic was restarted at his last appointment but he never filled the prescription.  We discussed discontinuing Jardiance in the setting of CKD stage IV but his nephrologist is in agreement with continuing SGLT2 therapy.  He is concerned that his blood sugar is elevated as he describes increased thirst recently. -New prescription for Ozempic 0.25 mg weekly sent today.  Continue Jardiance and glipizide as currently prescribed.  Repeat A1c ordered.      CKD (chronic kidney disease) stage 4, GFR 15-29 ml/min (HCC)   CKD stage IV.  Currently prescribed losartan and Jardiance.  Last seen by nephrology in November 2024.  Reports that he is scheduled for follow-up next month.  Repeat labs ordered today.      Dyslipidemia   Currently prescribed pravastatin 80 mg daily.  Repeat lipid panel ordered today.      Tobacco use   He continues to smoke 1 pack/day of cigarettes and has been smoking since age 34.  He remains precontemplative with regards to cessation but is in agreement with undergoing lung cancer screening.  We will message the lung cancer screening program today.       Return in about 3 months (around 01/15/2024).    Billie Lade, MD

## 2023-10-17 NOTE — Assessment & Plan Note (Signed)
Currently prescribed pravastatin 80 mg daily.  Repeat lipid panel ordered today.

## 2023-10-17 NOTE — Assessment & Plan Note (Signed)
He continues to smoke 1 pack/day of cigarettes and has been smoking since age 63.  He remains precontemplative with regards to cessation but is in agreement with undergoing lung cancer screening.  We will message the lung cancer screening program today.

## 2023-10-17 NOTE — Assessment & Plan Note (Signed)
Remains adequately controlled on current antihypertensive regimen consisting of amlodipine 5 mg daily, carvedilol 3.125 mg twice daily, chlorthalidone 25 mg every other day, clonidine 0.2 mg 3 times daily, and losartan 100 mg daily.  No medication changes are indicated at this time.

## 2023-10-18 ENCOUNTER — Encounter: Payer: Self-pay | Admitting: Internal Medicine

## 2023-10-18 LAB — CMP14+EGFR
ALT: 11 [IU]/L (ref 0–44)
AST: 12 [IU]/L (ref 0–40)
Albumin: 4.1 g/dL (ref 3.9–4.9)
Alkaline Phosphatase: 101 [IU]/L (ref 44–121)
BUN/Creatinine Ratio: 10 (ref 10–24)
BUN: 29 mg/dL — ABNORMAL HIGH (ref 8–27)
Bilirubin Total: 0.3 mg/dL (ref 0.0–1.2)
CO2: 22 mmol/L (ref 20–29)
Calcium: 9.4 mg/dL (ref 8.6–10.2)
Chloride: 105 mmol/L (ref 96–106)
Creatinine, Ser: 2.93 mg/dL — ABNORMAL HIGH (ref 0.76–1.27)
Globulin, Total: 2.7 g/dL (ref 1.5–4.5)
Glucose: 244 mg/dL — ABNORMAL HIGH (ref 70–99)
Potassium: 4.2 mmol/L (ref 3.5–5.2)
Sodium: 141 mmol/L (ref 134–144)
Total Protein: 6.8 g/dL (ref 6.0–8.5)
eGFR: 24 mL/min/{1.73_m2} — ABNORMAL LOW (ref >59)

## 2023-10-18 LAB — TSH+FREE T4
Free T4: 1.19 ng/dL (ref 0.82–1.77)
TSH: 1.83 u[IU]/mL (ref 0.450–4.500)

## 2023-10-18 LAB — CBC WITH DIFFERENTIAL/PLATELET
Basophils Absolute: 0.1 10*3/uL (ref 0.0–0.2)
Basos: 1 %
EOS (ABSOLUTE): 0.2 10*3/uL (ref 0.0–0.4)
Eos: 3 %
Hematocrit: 33.4 % — ABNORMAL LOW (ref 37.5–51.0)
Hemoglobin: 11.3 g/dL — ABNORMAL LOW (ref 13.0–17.7)
Immature Grans (Abs): 0 10*3/uL (ref 0.0–0.1)
Immature Granulocytes: 0 %
Lymphocytes Absolute: 1.9 10*3/uL (ref 0.7–3.1)
Lymphs: 26 %
MCH: 28.3 pg (ref 26.6–33.0)
MCHC: 33.8 g/dL (ref 31.5–35.7)
MCV: 84 fL (ref 79–97)
Monocytes Absolute: 0.5 10*3/uL (ref 0.1–0.9)
Monocytes: 7 %
Neutrophils Absolute: 4.7 10*3/uL (ref 1.4–7.0)
Neutrophils: 63 %
Platelets: 219 10*3/uL (ref 150–450)
RBC: 4 x10E6/uL — ABNORMAL LOW (ref 4.14–5.80)
RDW: 12.6 % (ref 11.6–15.4)
WBC: 7.4 10*3/uL (ref 3.4–10.8)

## 2023-10-18 LAB — HEMOGLOBIN A1C
Est. average glucose Bld gHb Est-mCnc: 252 mg/dL
Hgb A1c MFr Bld: 10.4 % — ABNORMAL HIGH (ref 4.8–5.6)

## 2023-10-18 LAB — B12 AND FOLATE PANEL
Folate: 8 ng/mL (ref >3.0)
Vitamin B-12: 291 pg/mL (ref 232–1245)

## 2023-10-18 LAB — LIPID PANEL
Chol/HDL Ratio: 5 {ratio} (ref 0.0–5.0)
Cholesterol, Total: 144 mg/dL (ref 100–199)
HDL: 29 mg/dL — ABNORMAL LOW (ref >39)
LDL Chol Calc (NIH): 69 mg/dL (ref 0–99)
Triglycerides: 282 mg/dL — ABNORMAL HIGH (ref 0–149)
VLDL Cholesterol Cal: 46 mg/dL — ABNORMAL HIGH (ref 5–40)

## 2023-10-18 LAB — VITAMIN D 25 HYDROXY (VIT D DEFICIENCY, FRACTURES): Vit D, 25-Hydroxy: 26.2 ng/mL — ABNORMAL LOW (ref 30.0–100.0)

## 2023-10-20 ENCOUNTER — Encounter: Payer: Self-pay | Admitting: Internal Medicine

## 2023-10-20 ENCOUNTER — Other Ambulatory Visit: Payer: Self-pay | Admitting: Internal Medicine

## 2023-10-20 DIAGNOSIS — E1165 Type 2 diabetes mellitus with hyperglycemia: Secondary | ICD-10-CM

## 2023-10-20 MED ORDER — TRULICITY 0.75 MG/0.5ML ~~LOC~~ SOAJ: 0.7500 mg | SUBCUTANEOUS | 0 refills | Status: DC

## 2023-10-20 NOTE — Progress Notes (Signed)
New rx sent for Trulicity. Ozempic not covered by insurance. Trulicity preferred.

## 2023-10-21 ENCOUNTER — Other Ambulatory Visit: Payer: Self-pay | Admitting: Internal Medicine

## 2023-10-21 DIAGNOSIS — E7849 Other hyperlipidemia: Secondary | ICD-10-CM

## 2023-11-12 ENCOUNTER — Other Ambulatory Visit: Payer: Self-pay | Admitting: Internal Medicine

## 2023-11-12 DIAGNOSIS — E1165 Type 2 diabetes mellitus with hyperglycemia: Secondary | ICD-10-CM

## 2023-11-16 ENCOUNTER — Other Ambulatory Visit: Payer: Self-pay | Admitting: Internal Medicine

## 2023-11-16 DIAGNOSIS — E1100 Type 2 diabetes mellitus with hyperosmolarity without nonketotic hyperglycemic-hyperosmolar coma (NKHHC): Secondary | ICD-10-CM

## 2023-12-02 DIAGNOSIS — E211 Secondary hyperparathyroidism, not elsewhere classified: Secondary | ICD-10-CM | POA: Diagnosis not present

## 2023-12-02 DIAGNOSIS — N189 Chronic kidney disease, unspecified: Secondary | ICD-10-CM | POA: Diagnosis not present

## 2023-12-02 DIAGNOSIS — R809 Proteinuria, unspecified: Secondary | ICD-10-CM | POA: Diagnosis not present

## 2023-12-02 DIAGNOSIS — D631 Anemia in chronic kidney disease: Secondary | ICD-10-CM | POA: Diagnosis not present

## 2023-12-12 ENCOUNTER — Other Ambulatory Visit: Payer: Self-pay | Admitting: Internal Medicine

## 2023-12-12 DIAGNOSIS — E1165 Type 2 diabetes mellitus with hyperglycemia: Secondary | ICD-10-CM

## 2024-01-19 ENCOUNTER — Encounter: Payer: Self-pay | Admitting: Internal Medicine

## 2024-01-19 ENCOUNTER — Ambulatory Visit: Payer: 59 | Admitting: Internal Medicine

## 2024-01-19 ENCOUNTER — Telehealth: Payer: Self-pay

## 2024-01-19 VITALS — BP 114/76 | HR 66 | Ht 70.0 in | Wt 177.6 lb

## 2024-01-19 DIAGNOSIS — Z7985 Long-term (current) use of injectable non-insulin antidiabetic drugs: Secondary | ICD-10-CM | POA: Diagnosis not present

## 2024-01-19 DIAGNOSIS — E1122 Type 2 diabetes mellitus with diabetic chronic kidney disease: Secondary | ICD-10-CM

## 2024-01-19 DIAGNOSIS — Z7984 Long term (current) use of oral hypoglycemic drugs: Secondary | ICD-10-CM | POA: Diagnosis not present

## 2024-01-19 DIAGNOSIS — E785 Hyperlipidemia, unspecified: Secondary | ICD-10-CM

## 2024-01-19 DIAGNOSIS — I10 Essential (primary) hypertension: Secondary | ICD-10-CM | POA: Diagnosis not present

## 2024-01-19 DIAGNOSIS — N184 Chronic kidney disease, stage 4 (severe): Secondary | ICD-10-CM | POA: Diagnosis not present

## 2024-01-19 DIAGNOSIS — E1165 Type 2 diabetes mellitus with hyperglycemia: Secondary | ICD-10-CM | POA: Diagnosis not present

## 2024-01-19 DIAGNOSIS — E1159 Type 2 diabetes mellitus with other circulatory complications: Secondary | ICD-10-CM

## 2024-01-19 DIAGNOSIS — E1169 Type 2 diabetes mellitus with other specified complication: Secondary | ICD-10-CM

## 2024-01-19 MED ORDER — TRULICITY 3 MG/0.5ML ~~LOC~~ SOAJ: 3.0000 mg | SUBCUTANEOUS | 0 refills | Status: DC

## 2024-01-19 MED ORDER — TRULICITY 1.5 MG/0.5ML ~~LOC~~ SOAJ: 1.5000 mg | SUBCUTANEOUS | Status: DC

## 2024-01-19 NOTE — Progress Notes (Signed)
 Established Patient Office Visit  Subjective   Patient ID: Gregory Gordon, male    DOB: Apr 12, 1961  Age: 63 y.o. MRN: [id number]  Chief Complaint  Patient presents with   Care Management    Three month follow up    Gregory Gordon returns again today for routine follow-up.  He was last evaluated by me on 1/24.  Ultimately, Trulicity  was started for improved management of diabetes mellitus.  No additional medication changes were made, repeat labs ordered, and 5-month follow-up arranged. There have been no acute interval events.  Today he reports feeling well and has no acute concerns to discuss.  Past Medical History:  Diagnosis Date   Diabetes mellitus without complication (HCC)    High cholesterol    Hyperlipemia    Hypertension    Stroke [facility])    Past Surgical History:  Procedure Laterality Date   neg hx     TEE WITHOUT CARDIOVERSION N/A 12/10/2012   Procedure: TRANSESOPHAGEAL ECHOCARDIOGRAM (TEE);  Surgeon: Lenise Quince, MD;  Location: [facility] ENDOSCOPY;  Service: Cardiovascular;  Laterality: N/A;  Rm 4N24   Social History   Tobacco Use   Smoking status: Every Day    Current packs/day: 1.00    Average packs/day: 1 pack/day for 45.0 years (45.0 ttl pk-yrs)    Types: Cigarettes   Smokeless tobacco: Never  Vaping Use   Vaping status: Never Used  Substance Use Topics   Alcohol use: No   Drug use: No   Family History  Problem Relation Age of Onset   Heart attack Brother 19       Younger brother just had 2nd MI   No Known Allergies  Review of Systems  Constitutional:  Negative for chills and fever.  HENT:  Negative for sore throat.   Respiratory:  Negative for cough and shortness of breath.   Cardiovascular:  Negative for chest pain, palpitations and leg swelling.  Gastrointestinal:  Negative for abdominal pain, blood in stool, constipation, diarrhea, nausea and vomiting.  Genitourinary:  Negative for dysuria and hematuria.  Musculoskeletal:  Negative for myalgias.   Skin:  Negative for itching and rash.  Neurological:  Negative for dizziness and headaches.  Psychiatric/Behavioral:  Negative for depression and suicidal ideas.      Objective:     BP 114/76   Pulse 66   Ht 5\' 10"  (1.778 m)   Wt 177 lb 9.6 oz (80.6 kg)   SpO2 93%   BMI 25.48 kg/m  BP Readings from Last 3 Encounters:  01/19/24 114/76  10/17/23 124/74  07/18/23 130/72   Physical Exam Vitals reviewed.  Constitutional:      General: He is not in acute distress.    Appearance: Normal appearance. He is not ill-appearing.  HENT:     Head: Normocephalic and atraumatic.     Right Ear: External ear normal.     Left Ear: External ear normal.     Nose: Nose normal. No congestion or rhinorrhea.     Mouth/Throat:     Mouth: Mucous membranes are moist.     Pharynx: Oropharynx is clear.  Eyes:     General: No scleral icterus.    Extraocular Movements: Extraocular movements intact.     Conjunctiva/sclera: Conjunctivae normal.     Pupils: Pupils are equal, round, and reactive to light.  Cardiovascular:     Rate and Rhythm: Normal rate and regular rhythm.     Pulses: Normal pulses.     Heart sounds: Normal heart  sounds. No murmur heard. Pulmonary:     Effort: Pulmonary effort is normal.     Breath sounds: Normal breath sounds. No wheezing, rhonchi or rales.  Abdominal:     General: Abdomen is flat. Bowel sounds are normal. There is no distension.     Palpations: Abdomen is soft.     Tenderness: There is no abdominal tenderness.  Musculoskeletal:        General: No swelling or deformity. Normal range of motion.     Cervical back: Normal range of motion.  Skin:    General: Skin is warm and dry.     Capillary Refill: Capillary refill takes less than 2 seconds.  Neurological:     General: No focal deficit present.     Mental Status: He is alert and oriented to person, place, and time.     Motor: No weakness.  Psychiatric:        Mood and Affect: Mood normal.        Behavior:  Behavior normal.        Thought Content: Thought content normal.   Last CBC Lab Results  Component Value Date   WBC 7.4 10/17/2023   HGB 11.3 (L) 10/17/2023   HCT 33.4 (L) 10/17/2023   MCV 84 10/17/2023   MCH 28.3 10/17/2023   RDW 12.6 10/17/2023   PLT 219 10/17/2023   Last metabolic panel Lab Results  Component Value Date   GLUCOSE 244 (H) 10/17/2023   NA 141 10/17/2023   K 4.2 10/17/2023   CL 105 10/17/2023   CO2 22 10/17/2023   BUN 29 (H) 10/17/2023   CREATININE 2.93 (H) 10/17/2023   EGFR 24 (L) 10/17/2023   CALCIUM 9.4 10/17/2023   PROT 6.8 10/17/2023   ALBUMIN 4.1 10/17/2023   LABGLOB 2.7 10/17/2023   AGRATIO 2.1 07/31/2022   BILITOT 0.3 10/17/2023   ALKPHOS 101 10/17/2023   AST 12 10/17/2023   ALT 11 10/17/2023   ANIONGAP 10 09/25/2022   Last lipids Lab Results  Component Value Date   CHOL 144 10/17/2023   HDL 29 (L) 10/17/2023   LDLCALC 69 10/17/2023   TRIG 282 (H) 10/17/2023   CHOLHDL 5.0 10/17/2023   Last hemoglobin A1c Lab Results  Component Value Date   HGBA1C 10.4 (H) 10/17/2023   Last thyroid functions Lab Results  Component Value Date   TSH 1.830 10/17/2023   Last vitamin D  Lab Results  Component Value Date   VD25OH 26.2 (L) 10/17/2023   Last vitamin B12 and Folate Lab Results  Component Value Date   VITAMINB12 291 10/17/2023   FOLATE 8.0 10/17/2023     Assessment & Plan:   Problem List Items Addressed This Visit       HTN (hypertension)   Remains adequately controlled on current antihypertensive regimen.  No medication changes are indicated today.      DM2 (diabetes mellitus, type 2) (HCC) - Primary   POC A1c today has improved to 6.8 from 10.4 previously.  Trulicity  was resumed following his last appointment.  He is currently prescribed 1.5 mg weekly.  He additionally takes Jardiance  and glipizide .  No additional changes are indicated today.  Check urine microalbumin/creatinine ratio.      CKD (chronic kidney disease)  stage 4, GFR 15-29 ml/min (HCC)   Currently on ARB and SGLT2i.  He is followed by nephrology and reports that he has an appointment scheduled for July.  Will update urine microalbumin/creatinine ratio today.      Dyslipidemia  Lipid panel updated in January.  Total cholesterol 144 and LDL 69.  He is currently prescribed pravastatin  80 mg daily.  No medication changes are indicated today.      Return in about 3 months (around 04/19/2024).   Tobi Fortes, MD

## 2024-01-19 NOTE — Patient Instructions (Signed)
 It was a pleasure to see you today.  Thank you for giving us  the opportunity to be involved in your care.  Below is a brief recap of your visit and next steps.  We will plan to see you again in 3 months.  Summary Increase Trulicity  to 3 mg weekly Repeat A1c and urine study Follow up in 3 months

## 2024-01-19 NOTE — Assessment & Plan Note (Signed)
 POC A1c today has improved to 6.8 from 10.4 previously.  Trulicity  was resumed following his last appointment.  He is currently prescribed 1.5 mg weekly.  He additionally takes Jardiance  and glipizide .  No additional changes are indicated today.  Check urine microalbumin/creatinine ratio.

## 2024-01-19 NOTE — Telephone Encounter (Signed)
 Gregory Gordon

## 2024-01-19 NOTE — Assessment & Plan Note (Signed)
 Lipid panel updated in January.  Total cholesterol 144 and LDL 69.  He is currently prescribed pravastatin  80 mg daily.  No medication changes are indicated today.

## 2024-01-19 NOTE — Assessment & Plan Note (Signed)
 Remains adequately controlled on current antihypertensive regimen.  No medication changes are indicated today.

## 2024-01-19 NOTE — Assessment & Plan Note (Signed)
 Currently on ARB and SGLT2i.  He is followed by nephrology and reports that he has an appointment scheduled for July.  Will update urine microalbumin/creatinine ratio today.

## 2024-01-20 ENCOUNTER — Encounter: Payer: Self-pay | Admitting: Internal Medicine

## 2024-01-20 ENCOUNTER — Other Ambulatory Visit (HOSPITAL_COMMUNITY): Payer: Self-pay

## 2024-01-20 LAB — BAYER DCA HB A1C WAIVED: HB A1C (BAYER DCA - WAIVED): 6.8 % — ABNORMAL HIGH (ref 4.8–5.6)

## 2024-01-20 NOTE — Telephone Encounter (Signed)
 Per test claim:   30 day supply is $608  Patient has a $4000 deductible remaining.  Not eligible for patient assistance since he is commercially insured.   A $10 copay card is available, however it only covers up to $175 of patients out of pocket cost (bringing his copay to $433).   No other options for assistance available for this med at this time.

## 2024-01-20 NOTE — Telephone Encounter (Signed)
 Vm not set up

## 2024-01-21 ENCOUNTER — Other Ambulatory Visit: Payer: Self-pay | Admitting: Internal Medicine

## 2024-01-21 DIAGNOSIS — I10 Essential (primary) hypertension: Secondary | ICD-10-CM

## 2024-01-26 ENCOUNTER — Telehealth: Payer: Self-pay

## 2024-01-26 NOTE — Telephone Encounter (Signed)
 Copied from CRM [id number]. Topic: Clinical - Medication Question >> Jan 26, 2024  9:31 AM Hamdi H wrote: Reason for CRM: Patient's wife Felipa Horsfall called asking why the patient's trulicity  medication became more expensive when the dosage was increased. The patient used to pay $25/month and now it went up to $170/month. They would like to know how to bring the price back down. >> Jan 26, 2024  9:36 AM Hamdi H wrote: Call back number will be Felipa Horsfall at [phone].

## 2024-01-27 NOTE — Telephone Encounter (Signed)
 Spoke to [place] , advised to speak to patients insurance for cost

## 2024-02-19 ENCOUNTER — Other Ambulatory Visit: Payer: Self-pay | Admitting: Internal Medicine

## 2024-02-19 DIAGNOSIS — I10 Essential (primary) hypertension: Secondary | ICD-10-CM

## 2024-03-02 ENCOUNTER — Other Ambulatory Visit (HOSPITAL_COMMUNITY): Payer: Self-pay

## 2024-03-10 ENCOUNTER — Other Ambulatory Visit (HOSPITAL_COMMUNITY): Payer: Self-pay

## 2024-03-10 ENCOUNTER — Encounter: Payer: Self-pay | Admitting: Pharmacy Technician

## 2024-03-10 ENCOUNTER — Telehealth: Payer: Self-pay | Admitting: Pharmacy Technician

## 2024-03-10 NOTE — Telephone Encounter (Signed)
 Pharmacy Patient Advocate Encounter  Received notification from [facility] that Prior Authorization for Trulicity  1.5MG /0.5ML auto-injectors has been APPROVED from 03/10/2024 to 03/10/2025.   PA #/Case ID/Reference #: [id number]

## 2024-03-10 NOTE — Telephone Encounter (Signed)
 ERROR

## 2024-03-10 NOTE — Telephone Encounter (Signed)
 Pharmacy Patient Advocate Encounter   Received notification from CoverMyMeds that prior authorization for Trulicity  1.5MG /0.5ML auto-injectors is required/requested.   Insurance verification completed.   The patient is insured through [facility] .   Per test claim: PA required; PA submitted to above mentioned insurance via CoverMyMeds Key/confirmation #/EOC [id number] Status is pending

## 2024-03-10 NOTE — Telephone Encounter (Signed)
 Pharmacy Patient Advocate Encounter   Received notification from CoverMyMeds that prior authorization for Trulicity  1.5MG /0.5ML auto-injectors is required/requested.   Insurance verification completed.   The patient is insured through [facility] .   Per test claim: PA required; PA submitted to above mentioned insurance via CoverMyMeds Key/confirmation #/EOC [id number] Status is pending  This was a PA renewal request.

## 2024-03-20 ENCOUNTER — Other Ambulatory Visit: Payer: Self-pay | Admitting: Internal Medicine

## 2024-03-20 DIAGNOSIS — I10 Essential (primary) hypertension: Secondary | ICD-10-CM

## 2024-04-01 ENCOUNTER — Telehealth: Payer: Self-pay | Admitting: Pharmacy Technician

## 2024-04-01 ENCOUNTER — Other Ambulatory Visit (HOSPITAL_COMMUNITY): Payer: Self-pay

## 2024-04-01 NOTE — Telephone Encounter (Signed)
 Pharmacy Patient Advocate Encounter   Received notification from CoverMyMeds that prior authorization for Jardiance  10mg  tablets is required/requested.   Insurance verification completed.   The patient is insured through [facility] .   Per test claim: PA required; PA started via CoverMyMeds. KEY [id number] . Waiting for clinical questions to populate.

## 2024-04-01 NOTE — Telephone Encounter (Signed)
 Pharmacy Patient Advocate Encounter  Received notification from [facility] that Prior Authorization for Jardiance  10MG  tablets  has been APPROVED from 04/01/2024 to 04/01/2025. Ran test claim, Copay is $53.36. This test claim was processed through [facility]- copay amounts may vary at other pharmacies due to pharmacy/plan contracts, or as the patient moves through the different stages of their insurance plan.   PA #/Case ID/Reference #: [id number]

## 2024-04-01 NOTE — Telephone Encounter (Signed)
 Question and answers have been submitted. Status is pending.

## 2024-04-05 ENCOUNTER — Other Ambulatory Visit (HOSPITAL_COMMUNITY): Payer: Self-pay

## 2024-04-19 ENCOUNTER — Other Ambulatory Visit: Payer: Self-pay

## 2024-04-19 ENCOUNTER — Ambulatory Visit

## 2024-04-19 VITALS — BP 105/66 | HR 70 | Ht 63.0 in | Wt 180.0 lb

## 2024-04-19 DIAGNOSIS — N184 Chronic kidney disease, stage 4 (severe): Secondary | ICD-10-CM

## 2024-04-19 DIAGNOSIS — I10 Essential (primary) hypertension: Secondary | ICD-10-CM

## 2024-04-19 DIAGNOSIS — Z7985 Long-term (current) use of injectable non-insulin antidiabetic drugs: Secondary | ICD-10-CM | POA: Diagnosis not present

## 2024-04-19 DIAGNOSIS — Z7984 Long term (current) use of oral hypoglycemic drugs: Secondary | ICD-10-CM

## 2024-04-19 DIAGNOSIS — E1122 Type 2 diabetes mellitus with diabetic chronic kidney disease: Secondary | ICD-10-CM | POA: Diagnosis not present

## 2024-04-19 NOTE — Progress Notes (Addendum)
 Established Patient Office Visit  Subjective   Patient ID: Gregory Gordon, male    DOB: [birth year]  Age: 63 y.o. MRN: [id number]  Chief Complaint  Patient presents with   Medical Management of Chronic Issues    3 month follow up, states he has a hazey eye sight like looking into the sun and then looking away from it, also dizzy,nauseous,tired      Dizziness  See above chief complaint as HPI  Patient Active Problem List   Diagnosis Date Noted   CKD (chronic kidney disease) stage 4, GFR 15-29 ml/min (HCC) 11/15/2022   COPD suggested by initial evaluation (HCC) 09/26/2022   Tobacco use 07/17/2022   Arterial ischemic stroke, vertebrobasilar, cerebellar, acute (HCC) 12/08/2012   DM2 (diabetes mellitus, type 2) (HCC) 12/08/2012   Dyslipidemia 12/08/2012   HTN (hypertension) 12/08/2012   Family history of early CAD 12/08/2012   Q waves suggestive of previous myocardial infarction 12/08/2012      Review of Systems  Neurological:  Positive for dizziness.     Objective:     BP 105/66   Pulse 70   Ht 5' 3 (1.6 m)   Wt 180 lb 0.6 oz (81.7 kg)   SpO2 92%   BMI 31.89 kg/m  BP Readings from Last 3 Encounters:  04/19/24 105/66  01/19/24 114/76  10/17/23 124/74   Wt Readings from Last 3 Encounters:  04/19/24 180 lb 0.6 oz (81.7 kg)  01/19/24 177 lb 9.6 oz (80.6 kg)  10/17/23 185 lb (83.9 kg)     Physical Exam Vitals and nursing note reviewed.  Constitutional:      Appearance: Normal appearance.  HENT:     Head: Normocephalic.  Eyes:     Extraocular Movements: Extraocular movements intact.     Conjunctiva/sclera: Conjunctivae normal.     Pupils: Pupils are equal, round, and reactive to light.  Cardiovascular:     Rate and Rhythm: Normal rate and regular rhythm.  Pulmonary:     Effort: Pulmonary effort is normal.     Breath sounds: Normal breath sounds.  Musculoskeletal:     Cervical back: Normal range of motion and neck supple.  Neurological:     Mental  Status: He is alert and oriented to person, place, and time.  Psychiatric:        Mood and Affect: Mood normal.        Thought Content: Thought content normal.      Results for orders placed or performed in visit on 04/19/24  Bayer DCA Hb A1c Waived  Result Value Ref Range   HB A1C (BAYER DCA - WAIVED) 9.7 (H) 4.8 - 5.6 %    Last CBC Lab Results  Component Value Date   WBC 7.4 10/17/2023   HGB 11.3 (L) 10/17/2023   HCT 33.4 (L) 10/17/2023   MCV 84 10/17/2023   MCH 28.3 10/17/2023   RDW 12.6 10/17/2023   PLT 219 10/17/2023   Last metabolic panel Lab Results  Component Value Date   GLUCOSE 244 (H) 10/17/2023   NA 141 10/17/2023   K 4.2 10/17/2023   CL 105 10/17/2023   CO2 22 10/17/2023   BUN 29 (H) 10/17/2023   CREATININE 2.93 (H) 10/17/2023   EGFR 24 (L) 10/17/2023   CALCIUM 9.4 10/17/2023   PROT 6.8 10/17/2023   ALBUMIN 4.1 10/17/2023   LABGLOB 2.7 10/17/2023   AGRATIO 2.1 07/31/2022   BILITOT 0.3 10/17/2023   ALKPHOS 101 10/17/2023   AST 12 10/17/2023  ALT 11 10/17/2023   ANIONGAP 10 09/25/2022   Last lipids Lab Results  Component Value Date   CHOL 144 10/17/2023   HDL 29 (L) 10/17/2023   LDLCALC 69 10/17/2023   TRIG 282 (H) 10/17/2023   CHOLHDL 5.0 10/17/2023   Last hemoglobin A1c Lab Results  Component Value Date   HGBA1C 9.7 (H) 04/19/2024   Last thyroid functions Lab Results  Component Value Date   TSH 1.830 10/17/2023   Last vitamin D  Lab Results  Component Value Date   VD25OH 26.2 (L) 10/17/2023      The ASCVD Risk score (Arnett DK, et al., 2019) failed to calculate for the following reasons:   Risk score cannot be calculated because patient has a medical history suggesting prior/existing ASCVD    Assessment & Plan:   Problem List Items Addressed This Visit       Cardiovascular and Mediastinum   HTN (hypertension)   Remains adequately controlled on current antihypertensive regimen.  No medication changes are indicated  today.        Endocrine   DM2 (diabetes mellitus, type 2) (HCC) - Primary   POC A1c today was 9.7.  Was previously 6.8..  Will increase his Trulicity  to 3 mg based on today's A1c.  He additionally takes Jardiance  and glipizide .  No additional changes are indicated today.  Check urine microalbumin/creatinine ratio.      Relevant Medications   Dulaglutide  (TRULICITY ) 3 MG/0.5ML SOAJ   Other Relevant Orders   Bayer DCA Hb A1c Waived (Completed)    Return in about 4 months (around 08/20/2024) for chronic follow-up with PCP.    Leita Longs, FNP

## 2024-04-19 NOTE — Patient Instructions (Signed)
 Stop taking the chlorthalidone  25 mg given low blood pressure readings in office and reported dizziness.

## 2024-04-20 LAB — BAYER DCA HB A1C WAIVED: HB A1C (BAYER DCA - WAIVED): 9.7 % — ABNORMAL HIGH (ref 4.8–5.6)

## 2024-04-26 MED ORDER — TRULICITY 3 MG/0.5ML ~~LOC~~ SOAJ: 3.0000 mg | SUBCUTANEOUS | 1 refills | Status: DC

## 2024-04-26 NOTE — Assessment & Plan Note (Signed)
 POC A1c today was 9.7.  Was previously 6.8..  Will increase his Trulicity  to 3 mg based on today's A1c.  He additionally takes Jardiance  and glipizide .  No additional changes are indicated today.  Check urine microalbumin/creatinine ratio.

## 2024-04-26 NOTE — Assessment & Plan Note (Signed)
 Remains adequately controlled on current antihypertensive regimen.  No medication changes are indicated today.

## 2024-05-13 NOTE — Telephone Encounter (Signed)
 Patient has not accessed MyChart since 10/2022. Please ensure patient is communicated with.

## 2024-05-18 ENCOUNTER — Other Ambulatory Visit: Payer: Self-pay

## 2024-05-18 ENCOUNTER — Telehealth: Payer: Self-pay

## 2024-05-18 DIAGNOSIS — I10 Essential (primary) hypertension: Secondary | ICD-10-CM

## 2024-05-18 NOTE — Telephone Encounter (Signed)
 Attempt #1 voicemail box not set up yet

## 2024-05-18 NOTE — Telephone Encounter (Signed)
 Copied from CRM #[id number]. Topic: Clinical - Prescription Issue >> May 18, 2024 10:48 AM Tinnie BROCKS wrote: Reason for CRM: Wife heron went to go pick up pts trulicity  and it was $953. She does not want to pay that. Please give her a call back at [phone].

## 2024-05-19 NOTE — Telephone Encounter (Signed)
 Prior Auth was approved but I just talked to pts wife and she stated pharmacy said it was 953.00 when she went to pick it up at pharmacy

## 2024-05-21 ENCOUNTER — Other Ambulatory Visit: Payer: Self-pay

## 2024-05-21 NOTE — Telephone Encounter (Signed)
 Mailed to patient

## 2024-05-25 ENCOUNTER — Telehealth: Payer: Self-pay

## 2024-05-25 NOTE — Telephone Encounter (Signed)
 Called  to ask How much was he paying for the lower dose?

## 2024-05-25 NOTE — Telephone Encounter (Signed)
 Attempt #1   Reason for call documented in pts chart

## 2024-05-28 ENCOUNTER — Telehealth: Payer: Self-pay

## 2024-05-28 ENCOUNTER — Other Ambulatory Visit: Payer: Self-pay

## 2024-05-28 DIAGNOSIS — E1165 Type 2 diabetes mellitus with hyperglycemia: Secondary | ICD-10-CM

## 2024-05-28 MED ORDER — JARDIANCE 10 MG PO TABS: 10.0000 mg | ORAL_TABLET | Freq: Every day | ORAL | 0 refills | Status: DC

## 2024-05-28 MED ORDER — OZEMPIC (0.25 OR 0.5 MG/DOSE) 2 MG/3ML ~~LOC~~ SOPN: 0.5000 mg | PEN_INJECTOR | SUBCUTANEOUS | 1 refills | Status: DC

## 2024-05-28 NOTE — Telephone Encounter (Signed)
 Copied from CRM [id number]. Topic: Clinical - Medication Refill >> May 28, 2024  9:37 AM Travis F wrote: Medication: JARDIANCE  10 MG TABS tablet [[id number]]  Has the patient contacted their pharmacy? Yes  (Agent: If yes, when and what did the pharmacy advise?) Contact office.   This is the patient's preferred pharmacy:  [facility] [address] [place] - [address] [place] [address] [place] [address] Phone: [phone] Fax: [phone]  Is this the correct pharmacy for this prescription? Yes  Has the prescription been filled recently? Yes  Is the patient out of the medication? Yes  Has the patient been seen for an appointment in the last year OR does the patient have an upcoming appointment? Yes  Can we respond through MyChart? No  Agent: Please be advised that Rx refills may take up to 3 business days. We ask that you follow-up with your pharmacy.

## 2024-05-28 NOTE — Telephone Encounter (Signed)
 Copied from CRM [id number]. Topic: Clinical - Prescription Issue >> May 28, 2024  9:32 AM Travis F wrote: Reason for CRM: Patient's daughter Camelia is calling in because patient was prescribed Trulicity  and it is going to be $900/ month and he is not going to pay that price it. She wants to know if he can be switched to Ozempic  because the cost is cheaper.

## 2024-05-31 ENCOUNTER — Telehealth: Payer: Self-pay

## 2024-05-31 NOTE — Telephone Encounter (Signed)
 Please advise pt that Ozempic  sent in to [facility] pharmacy. Will need to increase dose after 4 weeks.

## 2024-05-31 NOTE — Telephone Encounter (Signed)
 Attempt 1 Voicemail box not set up yet   Reason for call documented in pts chart

## 2024-06-09 DIAGNOSIS — R809 Proteinuria, unspecified: Secondary | ICD-10-CM | POA: Diagnosis not present

## 2024-06-09 DIAGNOSIS — D631 Anemia in chronic kidney disease: Secondary | ICD-10-CM | POA: Diagnosis not present

## 2024-06-09 DIAGNOSIS — N189 Chronic kidney disease, unspecified: Secondary | ICD-10-CM | POA: Diagnosis not present

## 2024-06-19 ENCOUNTER — Other Ambulatory Visit: Payer: Self-pay

## 2024-06-19 DIAGNOSIS — I10 Essential (primary) hypertension: Secondary | ICD-10-CM

## 2024-06-28 ENCOUNTER — Other Ambulatory Visit: Payer: Self-pay

## 2024-06-30 NOTE — Progress Notes (Signed)
 Gregory Gordon                                          MRN: [id number]   06/30/2024   The [facility]BC[facility] Quality Team Specialist reviewed this patient medical record for the purposes of chart review for care gap closure. The following were reviewed: chart review for care gap closure-glycemic status assessment.    VBCI Quality Team

## 2024-07-02 NOTE — Progress Notes (Signed)
 Gregory Gordon                                          MRN: [id number]   07/02/2024   The VBCI Quality Team Specialist reviewed this patient medical record for the purposes of chart review for care gap closure. The following were reviewed: chart review for care gap closure-colorectal cancer screening.    VBCI Quality Team

## 2024-07-18 ENCOUNTER — Other Ambulatory Visit: Payer: Self-pay

## 2024-07-18 DIAGNOSIS — I10 Essential (primary) hypertension: Secondary | ICD-10-CM

## 2024-08-09 ENCOUNTER — Ambulatory Visit

## 2024-08-09 DIAGNOSIS — E1122 Type 2 diabetes mellitus with diabetic chronic kidney disease: Secondary | ICD-10-CM

## 2024-08-09 DIAGNOSIS — I10 Essential (primary) hypertension: Secondary | ICD-10-CM | POA: Diagnosis not present

## 2024-08-09 DIAGNOSIS — Z7984 Long term (current) use of oral hypoglycemic drugs: Secondary | ICD-10-CM | POA: Diagnosis not present

## 2024-08-09 DIAGNOSIS — N184 Chronic kidney disease, stage 4 (severe): Secondary | ICD-10-CM | POA: Diagnosis not present

## 2024-08-09 DIAGNOSIS — E785 Hyperlipidemia, unspecified: Secondary | ICD-10-CM

## 2024-08-09 MED ORDER — GLIPIZIDE ER 5 MG PO TB24: 5.0000 mg | ORAL_TABLET | Freq: Every day | ORAL | 3 refills | Status: AC

## 2024-08-09 MED ORDER — JARDIANCE 10 MG PO TABS: 10.0000 mg | ORAL_TABLET | Freq: Every day | ORAL | 3 refills | Status: AC

## 2024-08-09 MED ORDER — TRULICITY 0.75 MG/0.5ML ~~LOC~~ SOAJ: 0.7500 mg | SUBCUTANEOUS | 2 refills | Status: AC

## 2024-08-09 NOTE — Progress Notes (Unsigned)
 Established Patient Office Visit  Subjective   Patient ID: Gregory Gordon, male    DOB: December 29, 1960  Age: 63 y.o. MRN: [id number]  Chief Complaint  Patient presents with   Medical Management of Chronic Issues    Pt here for follow up     HPI  Patient Active Problem List   Diagnosis Date Noted   CKD (chronic kidney disease) stage 4, GFR 15-29 ml/min (HCC) 11/15/2022   COPD suggested by initial evaluation 09/26/2022   Tobacco use 07/17/2022   Arterial ischemic stroke, vertebrobasilar, cerebellar, acute (HCC) 12/08/2012   DM2 (diabetes mellitus, type 2) (HCC) 12/08/2012   Dyslipidemia 12/08/2012   HTN (hypertension) 12/08/2012   Family history of early CAD 12/08/2012   Q waves suggestive of previous myocardial infarction 12/08/2012    ROS    Objective:     BP 120/84 (BP Location: Left Arm, Patient Position: Sitting, Cuff Size: Normal)   Pulse 74   Ht 5' 7 (1.702 m)   Wt 181 lb (82.1 kg)   SpO2 90%   BMI 28.35 kg/m  BP Readings from Last 3 Encounters:  08/09/24 120/84  04/19/24 105/66  01/19/24 114/76   Wt Readings from Last 3 Encounters:  08/09/24 181 lb (82.1 kg)  04/19/24 180 lb 0.6 oz (81.7 kg)  01/19/24 177 lb 9.6 oz (80.6 kg)     Physical Exam Vitals and nursing note reviewed.  Constitutional:      Appearance: Normal appearance.  HENT:     Head: Normocephalic.  Eyes:     Extraocular Movements: Extraocular movements intact.     Pupils: Pupils are equal, round, and reactive to light.  Cardiovascular:     Rate and Rhythm: Normal rate and regular rhythm.  Pulmonary:     Effort: Pulmonary effort is normal.     Breath sounds: Normal breath sounds.  Musculoskeletal:     Cervical back: Normal range of motion and neck supple.  Neurological:     Mental Status: He is alert and oriented to person, place, and time.  Psychiatric:        Mood and Affect: Mood normal.        Thought Content: Thought content normal.     Diabetic foot exam was performed  with the following findings:   No deformities, ulcerations, or other skin breakdown Normal sensation of 10g monofilament Intact posterior tibialis and dorsalis pedis pulses       The ASCVD Risk score (Arnett DK, et al., 2019) failed to calculate for the following reasons:   Risk score cannot be calculated because patient has a medical history suggesting prior/existing ASCVD    Assessment & Plan:   Problem List Items Addressed This Visit       Cardiovascular and Mediastinum   HTN (hypertension)   Remains adequately controlled on current antihypertensive regimen.  No medication changes are indicated today.      Relevant Orders   CMP14+EGFR (Completed)   CBC with Differential/Platelet (Completed)   TSH + free T4 (Completed)     Endocrine   DM2 (diabetes mellitus, type 2) (HCC)    He reports being out of his medications for about a month, other than Jardiance .  Recheck labs today.  Trulicity  and glipizide  refilled.      Relevant Medications   Dulaglutide  (TRULICITY ) 0.75 MG/0.5ML SOAJ   glipiZIDE  (GLUCOTROL  XL) 5 MG 24 hr tablet   JARDIANCE  10 MG TABS tablet   Other Relevant Orders   Urine Microalbumin w/creat. ratio (Completed)  HM Diabetes Foot Exam (Completed)     Genitourinary   CKD (chronic kidney disease) stage 4, GFR 15-29 ml/min (HCC)   Currently on ARB and SGLT2i.  He is followed by nephrology and reports that he has an appointment scheduled for July.  Will update urine microalbumin/creatinine ratio today.      Relevant Orders   Hemoglobin A1c (Completed)   VITAMIN D  25 Hydroxy (Vit-D Deficiency, Fractures) (Completed)     Other   Dyslipidemia   Lipid panel updated today.  Managed with pravastatin  80 mg.  No medication changes are indicated today.      Relevant Orders   TSH + free T4 (Completed)    No follow-ups on file.    Leita Longs, FNP

## 2024-08-10 ENCOUNTER — Ambulatory Visit: Payer: Self-pay

## 2024-08-10 NOTE — Assessment & Plan Note (Signed)
 Lipid panel updated today.  Managed with pravastatin  80 mg.  No medication changes are indicated today.

## 2024-08-10 NOTE — Assessment & Plan Note (Signed)
 He reports being out of his medications for about a month, other than Jardiance .  Recheck labs today.  Trulicity  and glipizide  refilled.

## 2024-08-10 NOTE — Assessment & Plan Note (Signed)
 Remains adequately controlled on current antihypertensive regimen.  No medication changes are indicated today.

## 2024-08-10 NOTE — Assessment & Plan Note (Signed)
 Currently on ARB and SGLT2i.  He is followed by nephrology and reports that he has an appointment scheduled for July.  Will update urine microalbumin/creatinine ratio today.

## 2024-08-11 LAB — TSH+FREE T4
Free T4: 1.24 ng/dL (ref 0.82–1.77)
TSH: 2.02 u[IU]/mL (ref 0.450–4.500)

## 2024-08-11 LAB — HEMOGLOBIN A1C
Est. average glucose Bld gHb Est-mCnc: 217 mg/dL
Hgb A1c MFr Bld: 9.2 % — ABNORMAL HIGH (ref 4.8–5.6)

## 2024-08-11 LAB — CBC WITH DIFFERENTIAL/PLATELET
Basophils Absolute: 0.1 x10E3/uL (ref 0.0–0.2)
Basos: 1 %
EOS (ABSOLUTE): 0.1 x10E3/uL (ref 0.0–0.4)
Eos: 1 %
Hematocrit: 37.4 % — ABNORMAL LOW (ref 37.5–51.0)
Hemoglobin: 12.3 g/dL — ABNORMAL LOW (ref 13.0–17.7)
Immature Grans (Abs): 0 x10E3/uL (ref 0.0–0.1)
Immature Granulocytes: 0 %
Lymphocytes Absolute: 1.6 x10E3/uL (ref 0.7–3.1)
Lymphs: 22 %
MCH: 28.2 pg (ref 26.6–33.0)
MCHC: 32.9 g/dL (ref 31.5–35.7)
MCV: 86 fL (ref 79–97)
Monocytes Absolute: 0.5 x10E3/uL (ref 0.1–0.9)
Monocytes: 7 %
Neutrophils Absolute: 5.1 x10E3/uL (ref 1.4–7.0)
Neutrophils: 69 %
Platelets: 229 x10E3/uL (ref 150–450)
RBC: 4.36 x10E6/uL (ref 4.14–5.80)
RDW: 12.8 % (ref 11.6–15.4)
WBC: 7.3 x10E3/uL (ref 3.4–10.8)

## 2024-08-11 LAB — CMP14+EGFR
ALT: 11 IU/L (ref 0–44)
AST: 10 IU/L (ref 0–40)
Albumin: 4.1 g/dL (ref 3.9–4.9)
Alkaline Phosphatase: 100 IU/L (ref 47–123)
BUN/Creatinine Ratio: 10 (ref 10–24)
BUN: 27 mg/dL (ref 8–27)
Bilirubin Total: 0.2 mg/dL (ref 0.0–1.2)
CO2: 22 mmol/L (ref 20–29)
Calcium: 9.4 mg/dL (ref 8.6–10.2)
Chloride: 103 mmol/L (ref 96–106)
Creatinine, Ser: 2.61 mg/dL — ABNORMAL HIGH (ref 0.76–1.27)
Globulin, Total: 2.3 g/dL (ref 1.5–4.5)
Glucose: 325 mg/dL — ABNORMAL HIGH (ref 70–99)
Potassium: 4.1 mmol/L (ref 3.5–5.2)
Sodium: 138 mmol/L (ref 134–144)
Total Protein: 6.4 g/dL (ref 6.0–8.5)
eGFR: 27 mL/min/1.73 — ABNORMAL LOW (ref >59)

## 2024-08-11 LAB — VITAMIN D 25 HYDROXY (VIT D DEFICIENCY, FRACTURES): Vit D, 25-Hydroxy: 18.4 ng/mL — ABNORMAL LOW (ref 30.0–100.0)

## 2024-08-11 LAB — MICROALBUMIN / CREATININE URINE RATIO
Creatinine, Urine: 69.9 mg/dL
Microalb/Creat Ratio: 501 mg/g{creat} — AB (ref 0–29)
Microalbumin, Urine: 350.5 ug/mL

## 2024-08-20 NOTE — Progress Notes (Signed)
 Gregory Gordon                                          MRN: [id number]   08/20/2024   The VBCI Quality Team Specialist reviewed this patient medical record for the purposes of chart review for care gap closure. The following were reviewed: abstraction for care gap closure-kidney health evaluation for diabetes:eGFR  and uACR.    VBCI Quality Team

## 2024-09-09 DIAGNOSIS — R809 Proteinuria, unspecified: Secondary | ICD-10-CM | POA: Diagnosis not present

## 2024-09-09 DIAGNOSIS — N189 Chronic kidney disease, unspecified: Secondary | ICD-10-CM | POA: Diagnosis not present

## 2024-09-09 DIAGNOSIS — E211 Secondary hyperparathyroidism, not elsewhere classified: Secondary | ICD-10-CM | POA: Diagnosis not present

## 2024-09-09 DIAGNOSIS — D631 Anemia in chronic kidney disease: Secondary | ICD-10-CM | POA: Diagnosis not present

## 2024-10-27 ENCOUNTER — Telehealth: Payer: Self-pay | Admitting: Pharmacy Technician

## 2024-10-27 ENCOUNTER — Other Ambulatory Visit (HOSPITAL_COMMUNITY): Payer: Self-pay

## 2024-10-27 NOTE — Telephone Encounter (Signed)
 Pharmacy Patient Advocate Encounter   Received notification from Onbase CMM KEY that prior authorization for Trulicity  0.75MG /0.5ML auto-injectors is required/requested.   Insurance verification completed.   The patient is insured through [employer].   Per test claim: PA required; PA submitted to above mentioned insurance via Latent Key/confirmation #/EOC [facility] Status is pending

## 2024-12-07 ENCOUNTER — Ambulatory Visit
